# Patient Record
Sex: Female | Born: 1990 | Hispanic: Yes | Marital: Single | Smoking: Never smoker
Health system: Southern US, Community
[De-identification: ages and names within clinical notes are randomized; demographics above are authoritative.]

## PROBLEM LIST (undated history)

## (undated) DIAGNOSIS — R87619 Unspecified abnormal cytological findings in specimens from cervix uteri: Secondary | ICD-10-CM

## (undated) DIAGNOSIS — IMO0002 Reserved for concepts with insufficient information to code with codable children: Secondary | ICD-10-CM

## (undated) HISTORY — PX: NO PAST SURGERIES: SHX2092

---

## 2010-09-22 ENCOUNTER — Other Ambulatory Visit: Payer: Self-pay | Admitting: Family Medicine

## 2010-09-22 DIAGNOSIS — Z3689 Encounter for other specified antenatal screening: Secondary | ICD-10-CM

## 2010-09-24 ENCOUNTER — Ambulatory Visit (HOSPITAL_COMMUNITY)
Admission: RE | Admit: 2010-09-24 | Discharge: 2010-09-24 | Disposition: A | Discharge status: Home / Self Care | Payer: Medicaid Other | Source: Ambulatory Visit | Attending: Family Medicine | Admitting: Family Medicine

## 2010-09-24 ENCOUNTER — Encounter (HOSPITAL_COMMUNITY): Payer: Self-pay

## 2010-09-24 DIAGNOSIS — Z363 Encounter for antenatal screening for malformations: Secondary | ICD-10-CM | POA: Insufficient documentation

## 2010-09-24 DIAGNOSIS — Z1389 Encounter for screening for other disorder: Secondary | ICD-10-CM | POA: Insufficient documentation

## 2010-09-24 DIAGNOSIS — Z3689 Encounter for other specified antenatal screening: Secondary | ICD-10-CM

## 2010-09-24 DIAGNOSIS — O358XX0 Maternal care for other (suspected) fetal abnormality and damage, not applicable or unspecified: Secondary | ICD-10-CM | POA: Insufficient documentation

## 2011-01-08 ENCOUNTER — Other Ambulatory Visit: Payer: Self-pay | Admitting: Family Medicine

## 2011-01-09 ENCOUNTER — Ambulatory Visit (HOSPITAL_COMMUNITY)
Admission: RE | Admit: 2011-01-09 | Discharge: 2011-01-09 | Disposition: A | Discharge status: Home / Self Care | Payer: Medicaid Other | Source: Ambulatory Visit | Attending: Family Medicine | Admitting: Family Medicine

## 2011-01-09 ENCOUNTER — Inpatient Hospital Stay (HOSPITAL_COMMUNITY)
Admission: AD | Admit: 2011-01-09 | Discharge: 2011-01-09 | Disposition: A | Discharge status: Home / Self Care | Payer: Medicaid Other | Source: Ambulatory Visit | Attending: Obstetrics & Gynecology | Admitting: Obstetrics & Gynecology

## 2011-01-09 ENCOUNTER — Other Ambulatory Visit: Payer: Self-pay | Admitting: Family Medicine

## 2011-01-09 DIAGNOSIS — O365990 Maternal care for other known or suspected poor fetal growth, unspecified trimester, not applicable or unspecified: Secondary | ICD-10-CM | POA: Insufficient documentation

## 2011-01-14 ENCOUNTER — Other Ambulatory Visit: Payer: Self-pay | Admitting: Family Medicine

## 2011-01-14 DIAGNOSIS — O4190X0 Disorder of amniotic fluid and membranes, unspecified, unspecified trimester, not applicable or unspecified: Secondary | ICD-10-CM

## 2011-01-16 ENCOUNTER — Ambulatory Visit (HOSPITAL_COMMUNITY)
Admission: RE | Admit: 2011-01-16 | Discharge: 2011-01-16 | Disposition: A | Discharge status: Home / Self Care | Payer: Medicaid Other | Source: Ambulatory Visit | Attending: Family Medicine | Admitting: Family Medicine

## 2011-01-16 ENCOUNTER — Ambulatory Visit (HOSPITAL_COMMUNITY): Payer: Medicaid Other

## 2011-01-16 DIAGNOSIS — O365990 Maternal care for other known or suspected poor fetal growth, unspecified trimester, not applicable or unspecified: Secondary | ICD-10-CM | POA: Insufficient documentation

## 2011-01-16 DIAGNOSIS — O4190X0 Disorder of amniotic fluid and membranes, unspecified, unspecified trimester, not applicable or unspecified: Secondary | ICD-10-CM

## 2011-01-16 DIAGNOSIS — Z3689 Encounter for other specified antenatal screening: Secondary | ICD-10-CM | POA: Insufficient documentation

## 2011-01-21 ENCOUNTER — Inpatient Hospital Stay (HOSPITAL_COMMUNITY)
Admission: AD | Admit: 2011-01-21 | Discharge: 2011-01-23 | DRG: 775 | Disposition: A | Discharge status: Home / Self Care | Payer: Medicaid Other | Source: Ambulatory Visit | Attending: Family Medicine | Admitting: Family Medicine

## 2011-01-21 DIAGNOSIS — O365990 Maternal care for other known or suspected poor fetal growth, unspecified trimester, not applicable or unspecified: Secondary | ICD-10-CM | POA: Diagnosis present

## 2011-01-21 DIAGNOSIS — O4100X0 Oligohydramnios, unspecified trimester, not applicable or unspecified: Principal | ICD-10-CM | POA: Diagnosis present

## 2011-01-21 DIAGNOSIS — Z22330 Carrier of Group B streptococcus: Secondary | ICD-10-CM

## 2011-01-21 DIAGNOSIS — O9989 Other specified diseases and conditions complicating pregnancy, childbirth and the puerperium: Secondary | ICD-10-CM

## 2011-01-21 DIAGNOSIS — O99892 Other specified diseases and conditions complicating childbirth: Secondary | ICD-10-CM

## 2011-01-21 LAB — CBC
HCT: 34 % — ABNORMAL LOW (ref 36.0–46.0)
Hemoglobin: 11.4 g/dL — ABNORMAL LOW (ref 12.0–15.0)
MCH: 29.2 pg (ref 26.0–34.0)
MCV: 87.2 fL (ref 78.0–100.0)
RBC: 3.9 MIL/uL (ref 3.87–5.11)
WBC: 17.7 10*3/uL — ABNORMAL HIGH (ref 4.0–10.5)

## 2012-05-23 ENCOUNTER — Other Ambulatory Visit (HOSPITAL_COMMUNITY): Payer: Self-pay | Admitting: Nurse Practitioner

## 2012-05-23 DIAGNOSIS — Z3689 Encounter for other specified antenatal screening: Secondary | ICD-10-CM

## 2012-05-23 LAB — OB RESULTS CONSOLE HEPATITIS B SURFACE ANTIGEN: Hepatitis B Surface Ag: NEGATIVE

## 2012-05-23 LAB — OB RESULTS CONSOLE RPR: RPR: NONREACTIVE

## 2012-05-23 LAB — OB RESULTS CONSOLE ABO/RH: RH Type: POSITIVE

## 2012-05-24 ENCOUNTER — Other Ambulatory Visit (HOSPITAL_COMMUNITY): Payer: Self-pay

## 2012-05-27 ENCOUNTER — Ambulatory Visit (HOSPITAL_COMMUNITY)
Admission: RE | Admit: 2012-05-27 | Discharge: 2012-05-27 | Disposition: A | Discharge status: Home / Self Care | Payer: Medicaid Other | Source: Ambulatory Visit | Attending: Nurse Practitioner | Admitting: Nurse Practitioner

## 2012-05-27 DIAGNOSIS — O358XX0 Maternal care for other (suspected) fetal abnormality and damage, not applicable or unspecified: Secondary | ICD-10-CM | POA: Insufficient documentation

## 2012-05-27 DIAGNOSIS — Z363 Encounter for antenatal screening for malformations: Secondary | ICD-10-CM | POA: Insufficient documentation

## 2012-05-27 DIAGNOSIS — Z3689 Encounter for other specified antenatal screening: Secondary | ICD-10-CM

## 2012-05-27 DIAGNOSIS — Z1389 Encounter for screening for other disorder: Secondary | ICD-10-CM | POA: Insufficient documentation

## 2012-08-10 NOTE — L&D Delivery Note (Signed)
Delivery Note At 10:40 PM a viable female was delivered via Vaginal, Spontaneous Delivery (Presentation: Left Occiput Anterior).  APGAR: 9, 9; weight .   Placenta status: Intact, Spontaneous.  Cord:  with the following complications: None.   Anesthesia:   Episiotomy: None Lacerations: small peri-urethral  Suture Repair: n/a Est. Blood Loss (mL): 150  Mom to postpartum.  Baby to nursery-stable.  Kelly Monroe, Kelly Monroe 09/25/2012, 11:00 PM

## 2012-09-25 ENCOUNTER — Encounter (HOSPITAL_COMMUNITY): Payer: Self-pay | Admitting: *Deleted

## 2012-09-25 ENCOUNTER — Inpatient Hospital Stay (HOSPITAL_COMMUNITY)
Admission: AD | Admit: 2012-09-25 | Discharge: 2012-09-27 | DRG: 774 | Disposition: A | Discharge status: Home / Self Care | Payer: Medicaid Other | Source: Ambulatory Visit | Attending: Obstetrics & Gynecology | Admitting: Obstetrics & Gynecology

## 2012-09-25 DIAGNOSIS — Z22330 Carrier of Group B streptococcus: Secondary | ICD-10-CM

## 2012-09-25 DIAGNOSIS — O99892 Other specified diseases and conditions complicating childbirth: Secondary | ICD-10-CM | POA: Diagnosis present

## 2012-09-25 HISTORY — DX: Reserved for concepts with insufficient information to code with codable children: IMO0002

## 2012-09-25 HISTORY — DX: Unspecified abnormal cytological findings in specimens from cervix uteri: R87.619

## 2012-09-25 LAB — CBC
HCT: 33.6 % — ABNORMAL LOW (ref 36.0–46.0)
MCV: 87 fL (ref 78.0–100.0)
RBC: 3.86 MIL/uL — ABNORMAL LOW (ref 3.87–5.11)
RDW: 13.9 % (ref 11.5–15.5)
WBC: 12.6 10*3/uL — ABNORMAL HIGH (ref 4.0–10.5)

## 2012-09-25 LAB — TYPE AND SCREEN: Antibody Screen: NEGATIVE

## 2012-09-25 MED ORDER — FLEET ENEMA 7-19 GM/118ML RE ENEM: 1.0000 | ENEMA | RECTAL | Status: DC | PRN

## 2012-09-25 MED ORDER — OXYCODONE-ACETAMINOPHEN 5-325 MG PO TABS: 1.0000 | ORAL_TABLET | ORAL | Status: DC | PRN

## 2012-09-25 MED ORDER — ONDANSETRON HCL 4 MG/2ML IJ SOLN: 4.0000 mg | Freq: Four times a day (QID) | INTRAMUSCULAR | Status: DC | PRN

## 2012-09-25 MED ORDER — LACTATED RINGERS IV SOLN
INTRAVENOUS | Status: DC
  Administered 2012-09-25 (×2): via INTRAVENOUS

## 2012-09-25 MED ORDER — LACTATED RINGERS IV SOLN: 500.0000 mL | INTRAVENOUS | Status: DC | PRN

## 2012-09-25 MED ORDER — LIDOCAINE HCL (PF) 1 % IJ SOLN
30.0000 mL | INTRAMUSCULAR | Status: DC | PRN
  Filled 2012-09-25: qty 30

## 2012-09-25 MED ORDER — CITRIC ACID-SODIUM CITRATE 334-500 MG/5ML PO SOLN: 30.0000 mL | ORAL | Status: DC | PRN

## 2012-09-25 MED ORDER — OXYTOCIN 40 UNITS IN LACTATED RINGERS INFUSION - SIMPLE MED
62.5000 mL/h | INTRAVENOUS | Status: DC
  Administered 2012-09-25: 62.5 mL/h via INTRAVENOUS
  Filled 2012-09-25: qty 1000

## 2012-09-25 MED ORDER — SODIUM CHLORIDE 0.9 % IV SOLN
2.0000 g | Freq: Once | INTRAVENOUS | Status: AC
  Administered 2012-09-25: 2 g via INTRAVENOUS
  Filled 2012-09-25: qty 2000

## 2012-09-25 MED ORDER — ACETAMINOPHEN 325 MG PO TABS: 650.0000 mg | ORAL_TABLET | ORAL | Status: DC | PRN

## 2012-09-25 MED ORDER — IBUPROFEN 600 MG PO TABS
600.0000 mg | ORAL_TABLET | Freq: Four times a day (QID) | ORAL | Status: DC | PRN
  Administered 2012-09-25: 600 mg via ORAL
  Filled 2012-09-25: qty 1

## 2012-09-25 MED ORDER — OXYTOCIN BOLUS FROM INFUSION: 500.0000 mL | INTRAVENOUS | Status: DC

## 2012-09-25 NOTE — MAU Note (Signed)
Contractions every 5 minutes with some vaginal spotting.

## 2012-09-25 NOTE — H&P (Signed)
Kelly Monroe is a 22 y.o. female G3P2002 at 38w 2/7d presenting for onset of labor. She notes that contractions started at 4 PM and have increased in frequency. They are regular and painful. No leakage of fluids.    Patient speaks Spanish was accompanied by interpreter.   Maternal Medical History:  Reason for admission: Contractions.   Contractions: Onset was 3-5 hours ago.   Frequency: regular.    Fetal activity: Perceived fetal activity is normal.   Last perceived fetal movement was within the past hour.    Prenatal complications: no prenatal complications   OB History   Grav Para Term Preterm Abortions TAB SAB Ect Mult Living   1              No past medical history on file. No past surgical history on file. Family History: family history is not on file. Social History:  has no tobacco, alcohol, and drug history on file.   Prenatal Transfer Tool  Maternal Diabetes: No Genetic Screening: Normal Maternal Ultrasounds/Referrals: Normal Fetal Ultrasounds or other Referrals:  None Maternal Substance Abuse:  No Significant Maternal Medications:  None Significant Maternal Lab Results:  Lab values include: Group B Strep positive Other Comments:  None  Review of Systems  All other systems reviewed and are negative.    Dilation: 6 Effacement (%): 70 Station: -2 Exam by:: L. Munford RN There were no vitals taken for this visit. Maternal Exam:  Uterine Assessment: Contraction strength is firm.  Contraction frequency is regular.   Abdomen: Patient reports no abdominal tenderness. Fetal presentation: vertex  Introitus: Normal vulva. Normal vagina.  Ferning test: not done.   Pelvis: adequate for delivery.      Physical Exam  Constitutional: She is oriented to person, place, and time. She appears well-developed and well-nourished. She appears distressed.  HENT:  Head: Normocephalic and atraumatic.  Eyes: Conjunctivae are normal.  Neck: Normal range of  motion.  Cardiovascular: Normal rate and regular rhythm.   Respiratory: Effort normal and breath sounds normal.  GI: Soft. Bowel sounds are normal.  Musculoskeletal: She exhibits no edema.  Neurological: She is alert and oriented to person, place, and time. She has normal reflexes.    Prenatal labs: ABO, Rh:  O pos Antibody:  neg Rubella:  immune RPR:   NR HBsAg:   neg HIV:   neg GBS: Positive (02/04 0000)   Assessment/Plan: 22 year old F G3P2002 @ 38w 2d by LMP presenting in active labor.  - Admit to L&D - Give amp for GBS positive - Does not want epidural or pain med - Toco and FHR per protocol   Clinton Sawyer, EDWARD 09/25/2012, 9:28 PM  I was consulted RE: exam and agree with above.  [place], CNM 09/25/2012 11:41 PM

## 2012-09-26 ENCOUNTER — Encounter (HOSPITAL_COMMUNITY): Payer: Self-pay | Admitting: *Deleted

## 2012-09-26 DIAGNOSIS — Z22330 Carrier of Group B streptococcus: Secondary | ICD-10-CM

## 2012-09-26 DIAGNOSIS — O9989 Other specified diseases and conditions complicating pregnancy, childbirth and the puerperium: Secondary | ICD-10-CM

## 2012-09-26 LAB — CBC
MCH: 28.5 pg (ref 26.0–34.0)
MCHC: 32.6 g/dL (ref 30.0–36.0)
Platelets: 179 10*3/uL (ref 150–400)
RBC: 3.89 MIL/uL (ref 3.87–5.11)

## 2012-09-26 MED ORDER — DIPHENHYDRAMINE HCL 25 MG PO CAPS: 25.0000 mg | ORAL_CAPSULE | Freq: Four times a day (QID) | ORAL | Status: DC | PRN

## 2012-09-26 MED ORDER — TETANUS-DIPHTH-ACELL PERTUSSIS 5-2.5-18.5 LF-MCG/0.5 IM SUSP
0.5000 mL | Freq: Once | INTRAMUSCULAR | Status: AC
  Administered 2012-09-27: 0.5 mL via INTRAMUSCULAR

## 2012-09-26 MED ORDER — PRENATAL MULTIVITAMIN CH
1.0000 | ORAL_TABLET | Freq: Every day | ORAL | Status: DC
  Administered 2012-09-26: 1 via ORAL
  Filled 2012-09-26: qty 1

## 2012-09-26 MED ORDER — WITCH HAZEL-GLYCERIN EX PADS: 1.0000 "application " | MEDICATED_PAD | CUTANEOUS | Status: DC | PRN

## 2012-09-26 MED ORDER — ZOLPIDEM TARTRATE 5 MG PO TABS: 5.0000 mg | ORAL_TABLET | Freq: Every evening | ORAL | Status: DC | PRN

## 2012-09-26 MED ORDER — OXYCODONE-ACETAMINOPHEN 5-325 MG PO TABS: 1.0000 | ORAL_TABLET | ORAL | Status: DC | PRN

## 2012-09-26 MED ORDER — BENZOCAINE-MENTHOL 20-0.5 % EX AERO
1.0000 "application " | INHALATION_SPRAY | CUTANEOUS | Status: DC | PRN
  Administered 2012-09-26: 1 via TOPICAL
  Filled 2012-09-26: qty 56

## 2012-09-26 MED ORDER — INFLUENZA VIRUS VACC SPLIT PF IM SUSP: 0.5000 mL | INTRAMUSCULAR | Status: DC

## 2012-09-26 MED ORDER — LANOLIN HYDROUS EX OINT: TOPICAL_OINTMENT | CUTANEOUS | Status: DC | PRN

## 2012-09-26 MED ORDER — DIBUCAINE 1 % RE OINT: 1.0000 "application " | TOPICAL_OINTMENT | RECTAL | Status: DC | PRN

## 2012-09-26 MED ORDER — SENNOSIDES-DOCUSATE SODIUM 8.6-50 MG PO TABS
2.0000 | ORAL_TABLET | Freq: Every day | ORAL | Status: DC
  Administered 2012-09-26: 2 via ORAL

## 2012-09-26 MED ORDER — ONDANSETRON HCL 4 MG/2ML IJ SOLN: 4.0000 mg | INTRAMUSCULAR | Status: DC | PRN

## 2012-09-26 MED ORDER — ONDANSETRON HCL 4 MG PO TABS: 4.0000 mg | ORAL_TABLET | ORAL | Status: DC | PRN

## 2012-09-26 MED ORDER — IBUPROFEN 600 MG PO TABS
600.0000 mg | ORAL_TABLET | Freq: Four times a day (QID) | ORAL | Status: DC
Start: 2012-09-26 — End: 2012-09-27
  Administered 2012-09-26 – 2012-09-27 (×5): 600 mg via ORAL
  Filled 2012-09-26 (×5): qty 1

## 2012-09-26 MED ORDER — SIMETHICONE 80 MG PO CHEW: 80.0000 mg | CHEWABLE_TABLET | ORAL | Status: DC | PRN

## 2012-09-26 NOTE — Progress Notes (Signed)
UR chart review completed.  

## 2012-09-26 NOTE — Progress Notes (Signed)
Post Partum Day 1 Subjective: no complaints and + flatus Pt doing well. No complaints at this time other than some mild abdominal pain. Nurse claimed pt was bleeding a lot earlier postpartum. Pt states bleeding has improved.  Objective: Blood pressure 100/63, pulse 76, temperature 98.2 F (36.8 C), temperature source Oral, resp. rate 18, height 5\' 4"  (1.626 m), weight 77.111 kg (170 lb), unknown if currently breastfeeding.  Physical Exam:  General: alert, cooperative and no distress Lochia: appropriate Uterine Fundus: soft DVT Evaluation: No evidence of DVT seen on physical exam. Negative Homan's sign.   Recent Labs  09/25/12 2145 09/26/12 0540  HGB 11.4* 11.1*  HCT 33.6* 34.1*    Assessment/Plan: Plan for discharge tomorrow and Contraception Mirena IUD Breastfeeding: Pt latching on well. Feeding well. No complaints. Circumcision: Pt does not want child circumcised.  Pain controlled with Motrin, has not needed percocet.    LOS: 1 day   [place] 09/26/2012, 7:19 AM

## 2012-09-26 NOTE — Progress Notes (Signed)
I was present for the exam and agree with above. Small amount lochia. Hgb stable. Fundus firm.  [place], [place] 09/26/2012 7:44 AM

## 2012-09-27 MED ORDER — DOCUSATE SODIUM 100 MG PO CAPS: 100.0000 mg | ORAL_CAPSULE | Freq: Two times a day (BID) | ORAL | Status: DC | PRN

## 2012-09-27 MED ORDER — IBUPROFEN 600 MG PO TABS: 600.0000 mg | ORAL_TABLET | Freq: Four times a day (QID) | ORAL | Status: DC

## 2012-09-27 NOTE — Discharge Summary (Signed)
PPD#2 Kelly Monroe is a 22 y.o. [id number] presenting at [redacted]w[redacted]d with active labor. Patient progressed normally and had a spontaneous vaginal delivery with no complications. Her postpartum course has been unremarkable. She is breastfeeding and will [facility] for postpartum care and Mirena for contraception.  Patient doing well, no acute complaints today. Pt states very little bleeding and no abdominal pain. Pt ready to go home.  Obstetric Discharge Summary Reason for Admission: onset of labor Prenatal Procedures: none Intrapartum Procedures: spontaneous vaginal delivery Postpartum Procedures: none Complications-Operative and Postpartum: Bleeding immediately postpartum, HgB stable, resolved. Hemoglobin  Date Value Range Status  09/26/2012 11.1* 12.0 - 15.0 g/dL Final     HCT  Date Value Range Status  09/26/2012 34.1* 36.0 - 46.0 % Final    Physical Exam:  General: alert, cooperative and no distress Lochia: appropriate Uterine Fundus: soft DVT Evaluation: No evidence of DVT seen on physical exam. Negative Homan's sign.  Discharge Diagnoses: Term Pregnancy-delivered  Discharge Information: Date: 09/27/2012 Activity: pelvic rest Diet: routine Medications: motrin Condition: stable Instructions: refer to practice specific booklet Discharge to: home  Breastfeeding: baby latching on with no problems, mild areolar soreness. Circumcision: No Contraception: Mirena IUD, information given Patient would like to be discharged today.  Newborn Data: Live born female  Birth Weight: 8 lb 0.4 oz (3640 g) APGAR: 9, 9  Home with father.  [place] 09/27/2012, 7:34 AM  I saw and examined patient, reviewed delivery summary, progress notes, labs and history. I agree with above student note. Discharge orders done. Kelly Form, MD

## 2012-09-27 NOTE — Progress Notes (Signed)
Spoke to mother about Kelly being a Kelly Monroe in the room.  She states she understands.

## 2012-09-29 NOTE — Discharge Summary (Signed)
Attestation of Attending Supervision of Advanced Practitioner (CNM/NP): Evaluation and management procedures were performed by the Advanced Practitioner under my supervision and collaboration.  I have reviewed the Advanced Practitioner's note and chart, and I agree with the management and plan.  Gaelyn Tukes 09/29/2012 11:53 AM   

## 2012-10-06 IMAGING — US US OB FOLLOW-UP
1 series · 12 of 28 positions shown · non-contrast
Comparison: none

[Series 1: us ob follow up · 12 of 49 slices shown]
[im 2/49]
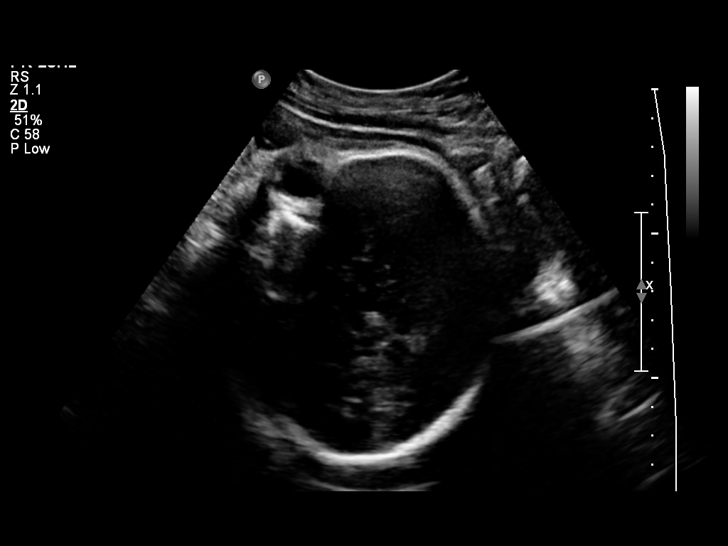
[im 6/49]
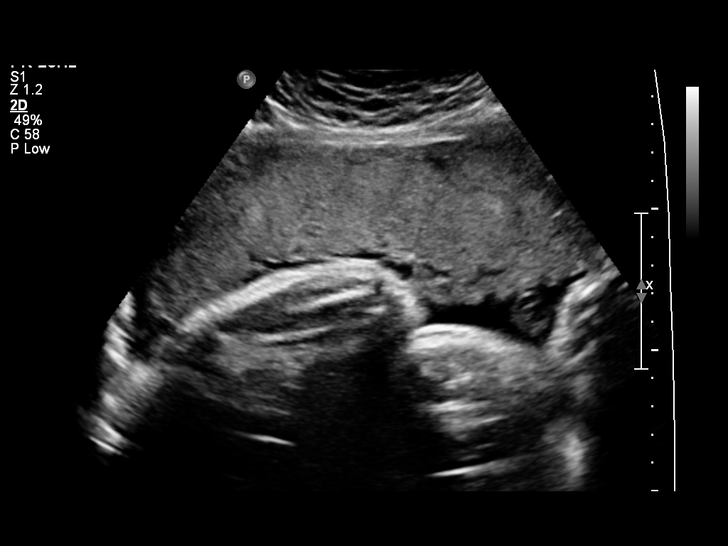
[im 9/49]
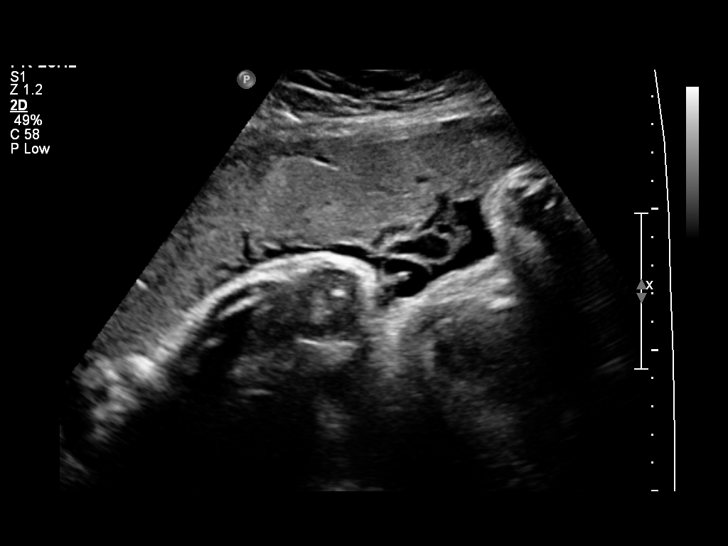
[im 15/49]
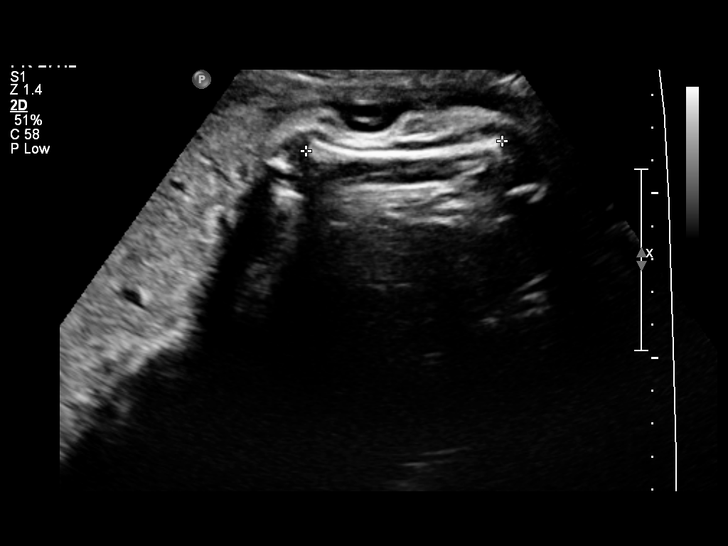
[im 18/49]
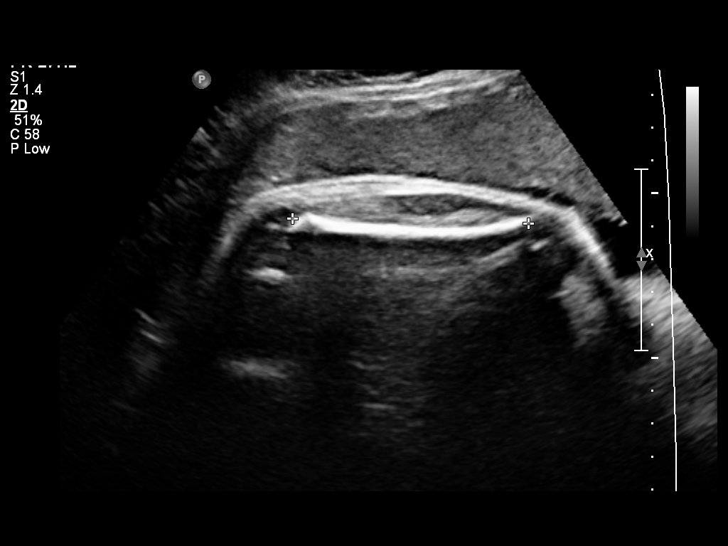
[im 22/49]
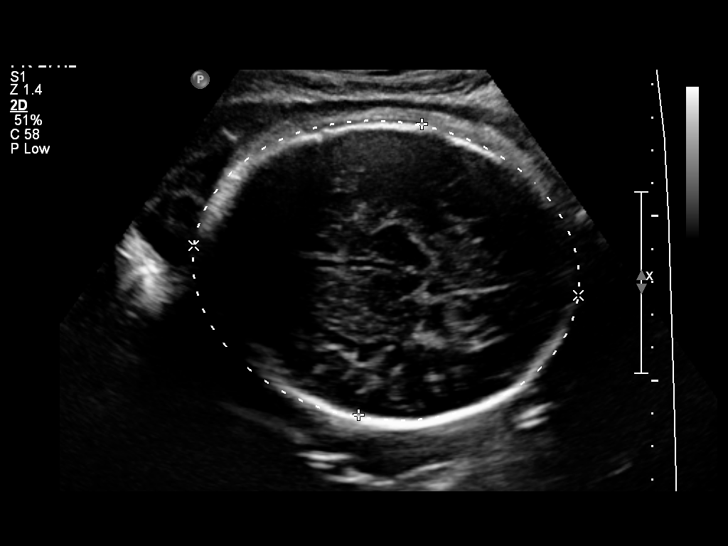
[im 27/49]
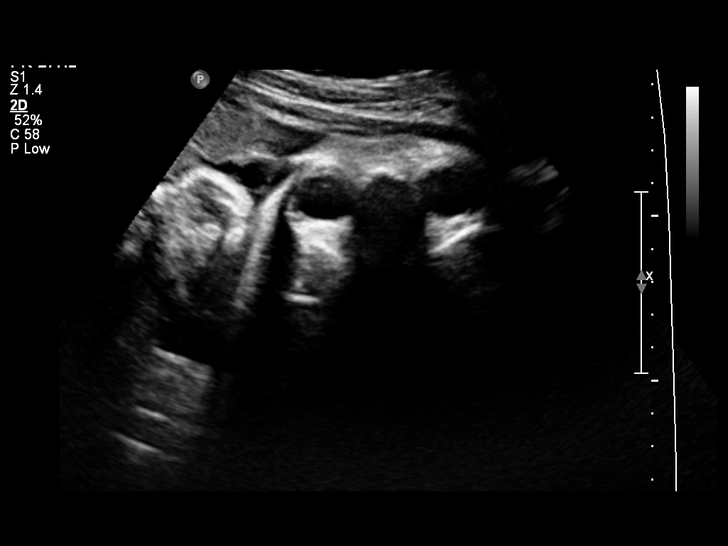
[im 31/49]
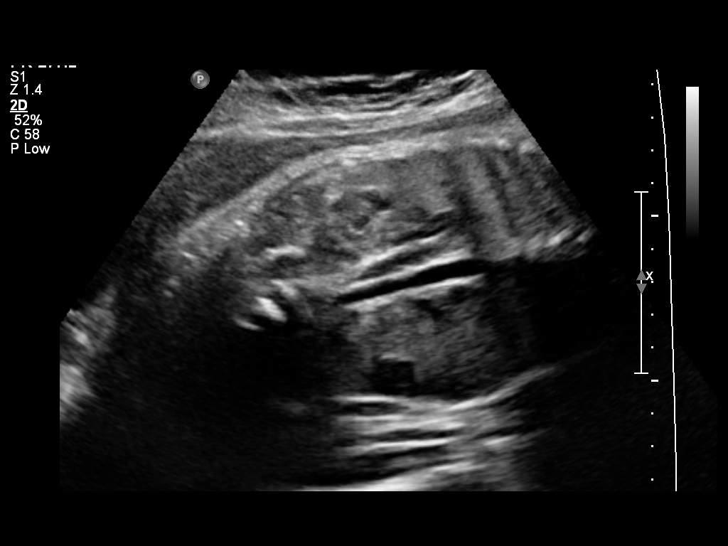
[im 34/49]
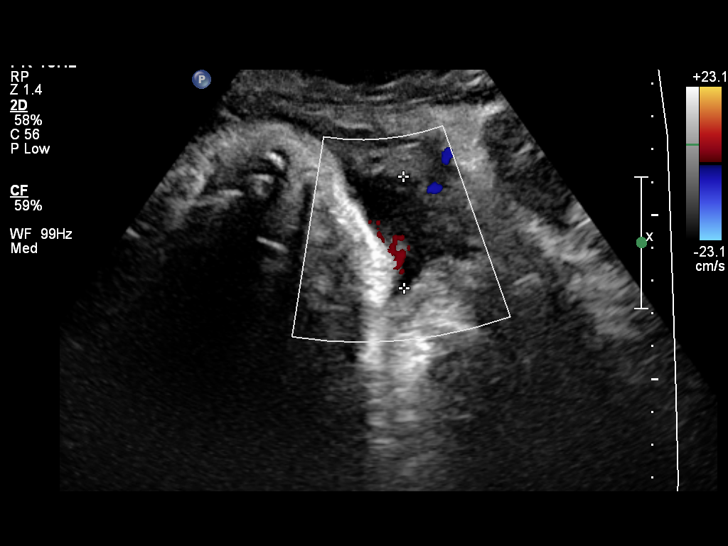
[im 40/49]
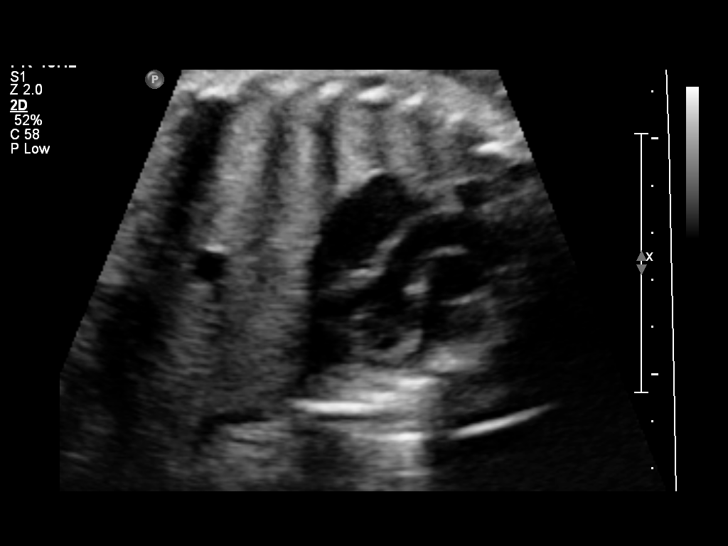
[im 43/49]
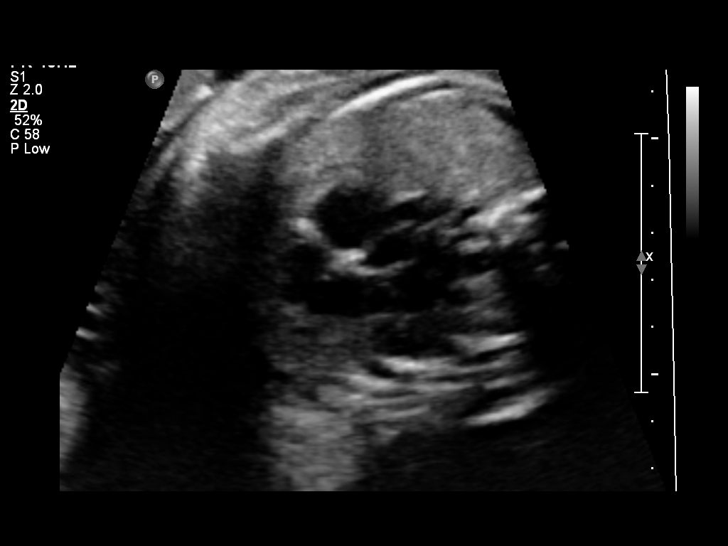
[im 47/49]
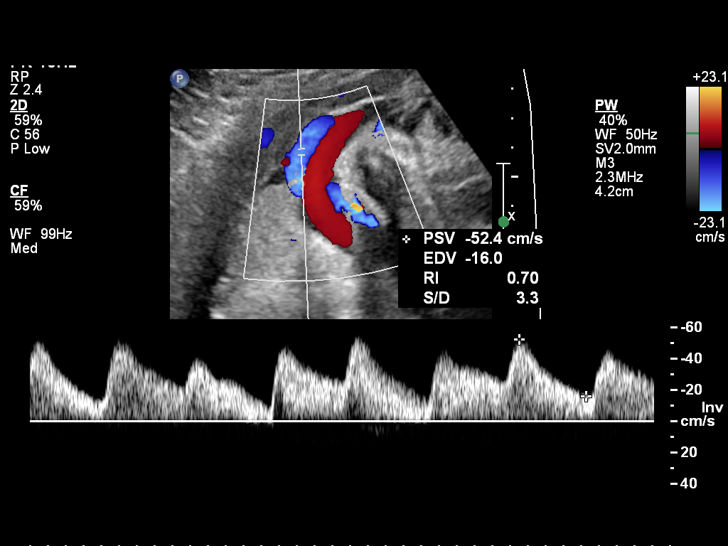

[12 of 28 positions shown; findings below may reference images not displayed]

OBSTETRICS REPORT
                      (Signed Final 01/09/2011 [DATE])

 Order#:         [id number]
                 42_O
Procedures

 US OB FOLLOW UP                                       76816.1
 US UA CORD DOPPLER                                    76827.2
Indications

 Size less than dates (Small for gestational [AGE]
 FGR)
 Assess Fetal Growth / Estimated Fetal Weight
Fetal Evaluation

 Fetal Heart Rate:  133                          bpm
 Cardiac Activity:  Observed
 Presentation:      Cephalic
 Placenta:          Anterior, above cervical os
 P. Cord            Previously Visualized
 Insertion:

 Amniotic Fluid
 AFI FV:      Subjectively low-normal
 AFI Sum:     8.51    cm       13  %Tile     Larg Pckt:    3.39  cm
 RUQ:   2.43    cm   RLQ:    2.69   cm    LUQ:   3.39    cm
Biometry

 BPD:     89.7  mm     G. Age:  36w 2d                CI:        75.89   70 - 86
                                                      FL/HC:      21.9   20.8 -

 HC:     326.4  mm     G. Age:  37w 0d       21  %    HC/AC:      1.07   0.92 -

 AC:     304.5  mm     G. Age:  34w 3d        4  %    FL/BPD:     79.8   71 - 87
 FL:      71.6  mm     G. Age:  36w 5d       35  %    FL/AC:      23.5   20 - 24
 HUM:     59.6  mm     G. Age:  34w 4d       20  %

 Est. FW:    3325  gm    5 lb 15 oz      30  %
Gestational Age

 LMP:           37w 2d        Date:  04/23/10                 EDD:   01/28/11
 U/S Today:     36w 1d                                        EDD:   02/05/11
 Best:          37w 2d     Det. By:  LMP  (04/23/10)          EDD:   01/28/11
Anatomy

 Cranium:           Appears normal      Aortic Arch:       Previously seen
 Fetal Cavum:       Previously seen     Ductal Arch:       Previously seen
 Ventricles:        Appears normal      Diaphragm:         Appears normal
 Choroid Plexus:    Previously seen     Stomach:           Appears
                                                           normal, left
                                                           sided
 Cerebellum:        Previously seen     Abdomen:           Appears normal
 Posterior Fossa:   Previously seen     Abdominal Wall:    Previously seen
 Nuchal Fold:       Not applicable      Cord Vessels:      Appears normal
                    (>20 wks GA)                           (3 vessel cord)
 Face:              Appears normal      Kidneys:           Appear normal
                    (lips/profile/orbit
                    s)
 Heart:             Appears normal      Bladder:           Appears normal
                    (4 chamber &
                    axis)
 RVOT:              Appears normal      Spine:             Previously seen
 LVOT:              Appears normal      Limbs:             Previously seen

 Other:     Heels and 5th digit previously seen. Nasal bone
            visualized. Fetus appears to be a female.
Doppler - Fetal Vessels

 Umbilical Artery
 S/D:   2.8            74  %tile
 Umbilical Artery
 Absent DFV:    No     Reverse DFV:    No

Cervix Uterus Adnexa

 Cervix:       Not visualized (advanced GA >34 wks)
 Left Ovary:    Previously seen.
 Right Ovary:   Previously seen
Impression

 Assigned GA is currently 37w 2d.   Downward trending of
 fetal growth curve, with EFW currently at 30 %ile and AC at 4
 %ile.
 Amniotic fluid in low-normal range, with AFI of 8.51 cm.
 UA Doppler within normal limits.
 No late developing fetal abnormalities seen involving
 visualized anatomy.

 questions or concerns.

## 2012-10-07 ENCOUNTER — Telehealth (HOSPITAL_COMMUNITY): Payer: Self-pay | Admitting: *Deleted

## 2012-10-07 NOTE — Telephone Encounter (Signed)
Resolve episode 

## 2014-02-22 IMAGING — US US OB DETAIL+14 WK
1 of 2 series · 12 of 28 positions shown · non-contrast
Comparison: none

[Series 1: us ob detail +14 wk · 12 of 80 slices shown]
[im 1/80]
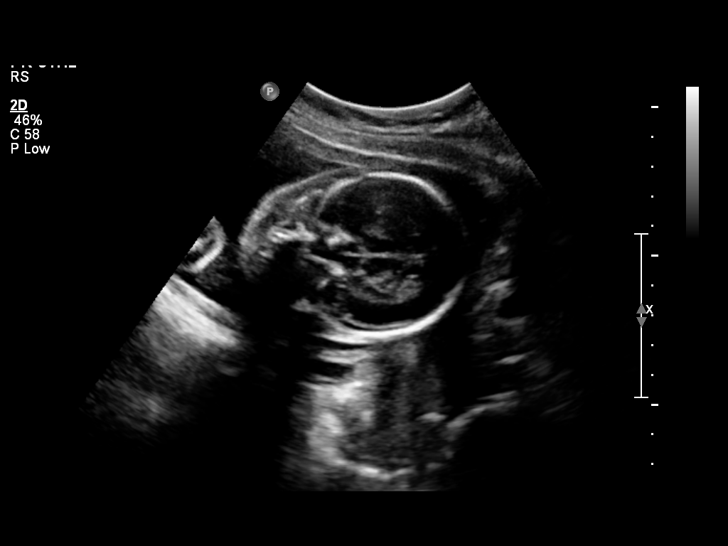
[im 7/80]
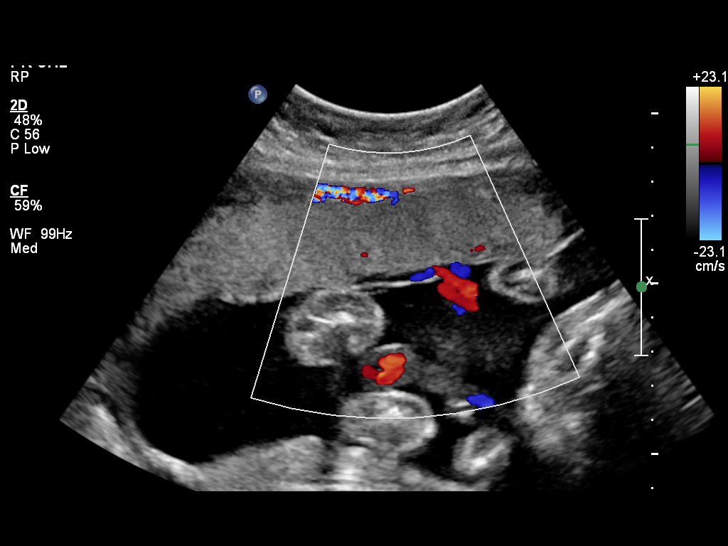
[im 13/80]
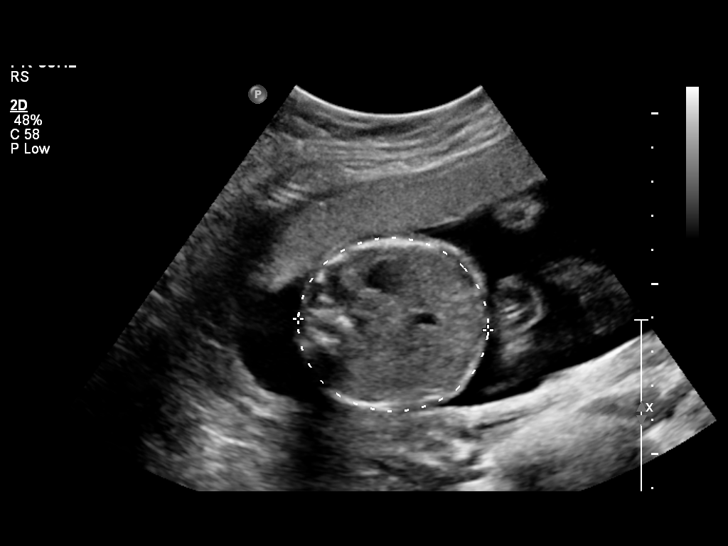
[im 22/80]
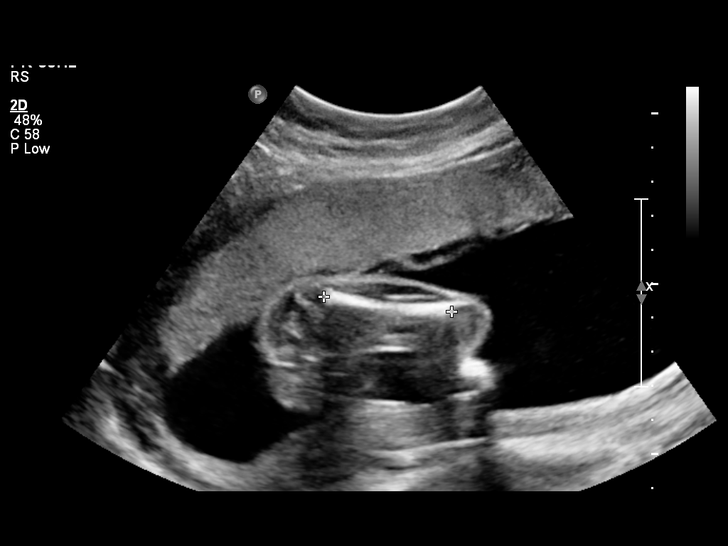
[im 28/80]
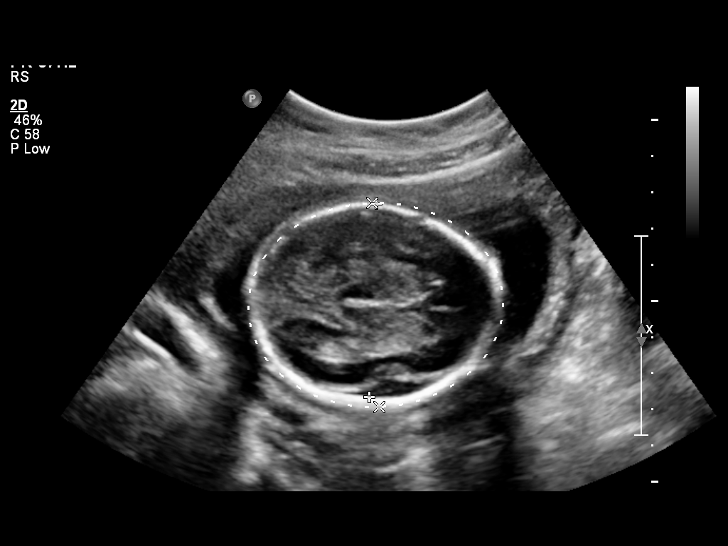
[im 34/80]
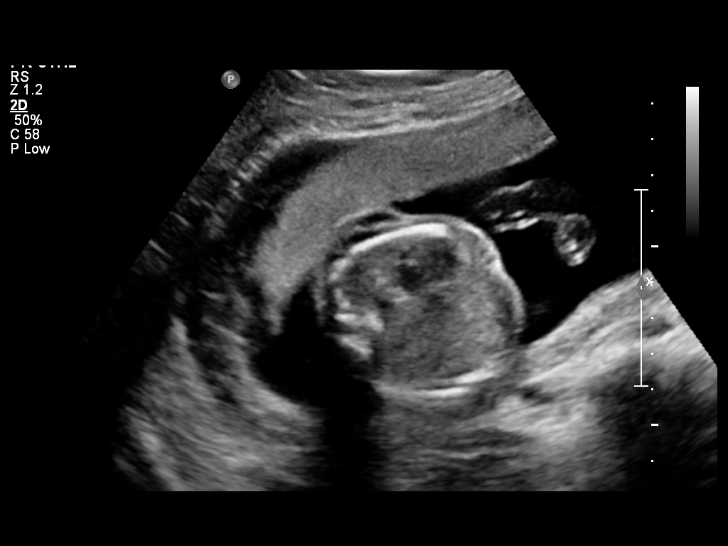
[im 43/80]
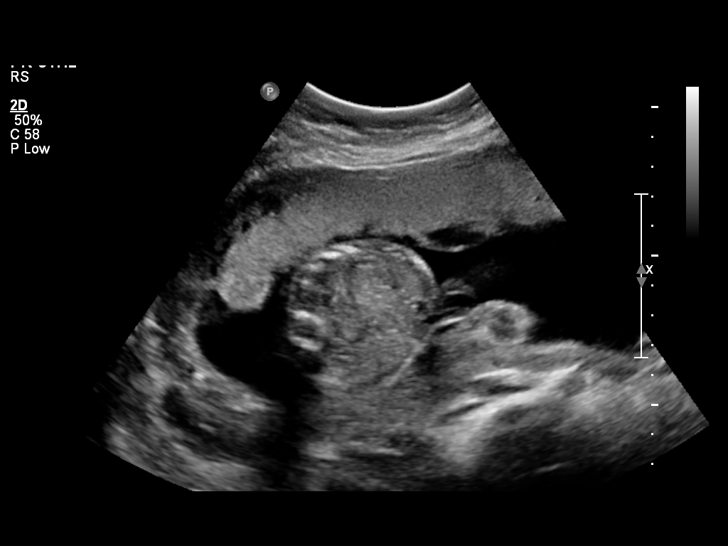
[im 49/80]
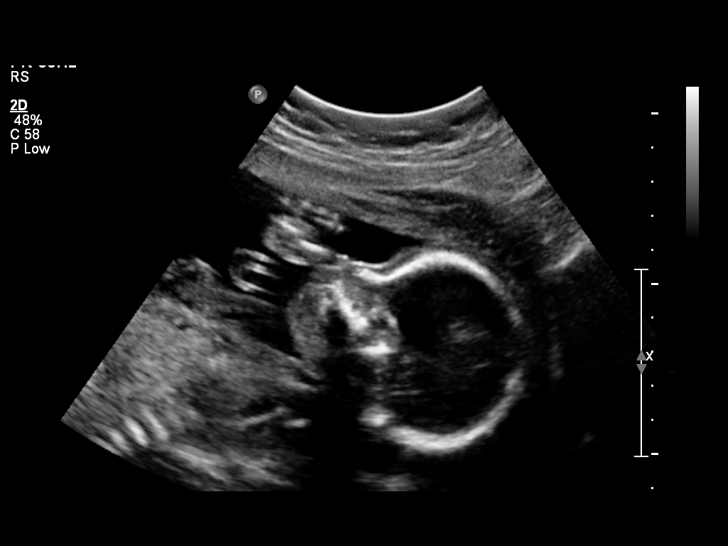
[im 55/80]
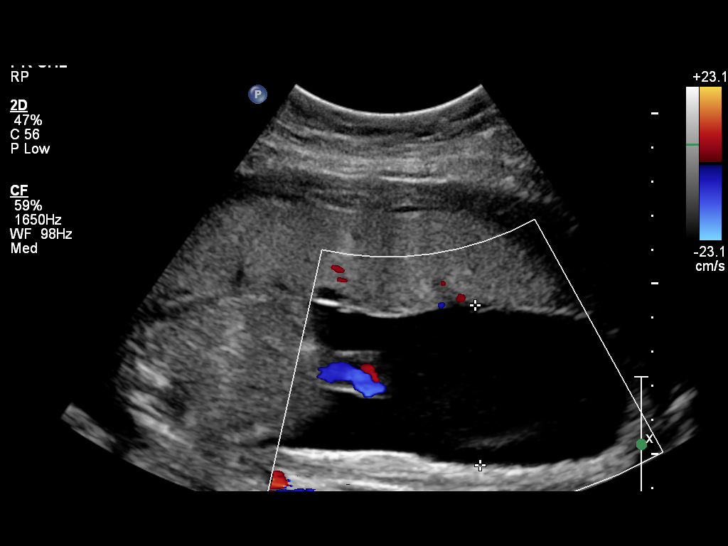
[im 64/80]
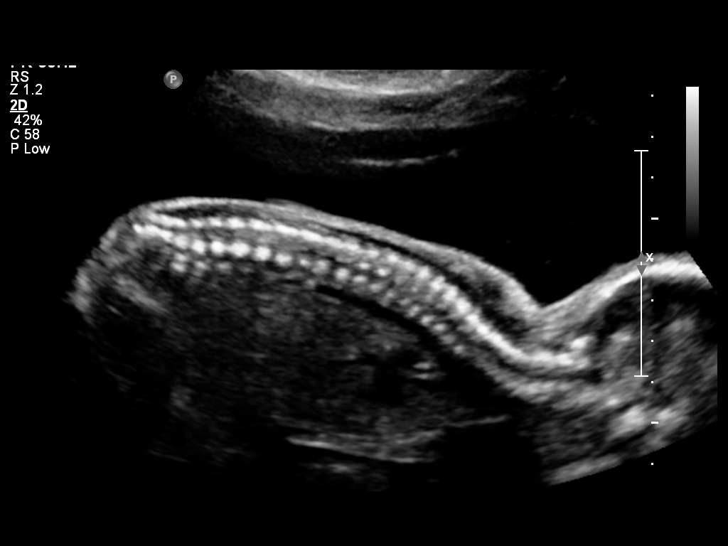
[im 70/80]
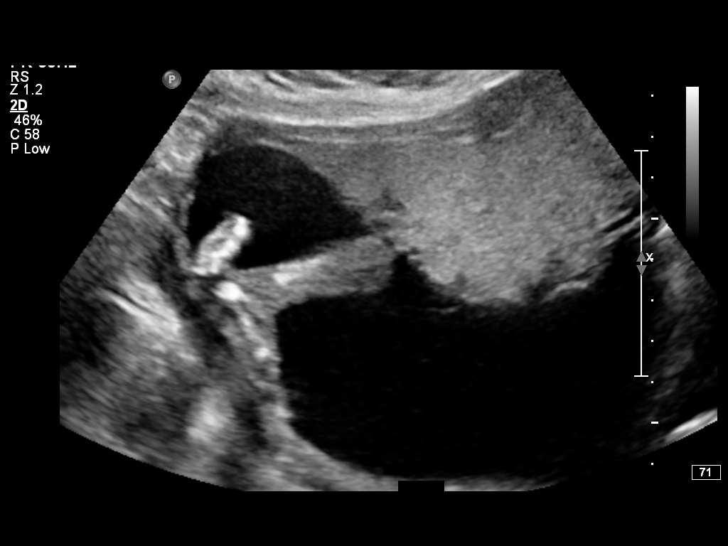
[im 76/80]
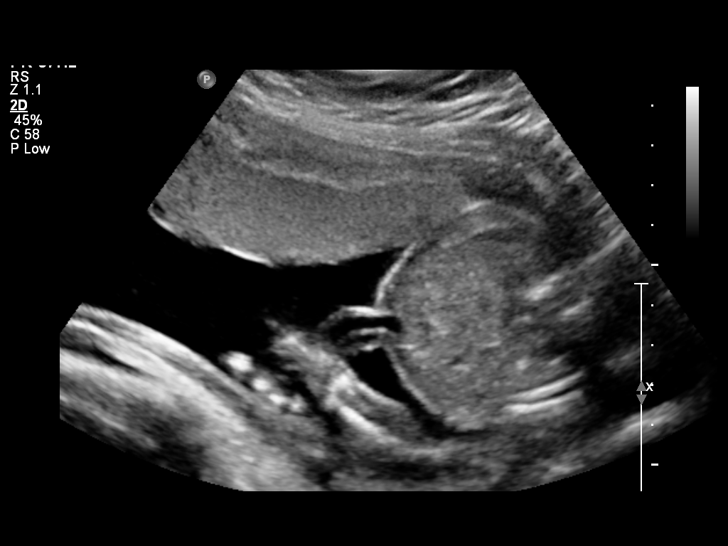

[12 of 28 positions shown; findings below may reference images not displayed]

OBSTETRICS REPORT
                      (Signed Final 05/27/2012 [DATE])

             KENNEDI

Service(s) Provided

 US OB DETAIL + 14 WK                                  76811.0
Indications

 Detailed fetal anatomic survey
Fetal Evaluation

 Num Of Fetuses:    1
 Preg. Location:    Intrauterine
 Fetal Heart Rate:  132                         bpm
 Cardiac Activity:  Observed
 Presentation:      Cephalic
 Placenta:          Anterior, above cervical os
 P. Cord            Visualized, central
 Insertion:

 Amniotic Fluid
 AFI FV:      Subjectively within normal limits
                                             Larg Pckt:     5.2  cm
Biometry

 BPD:     53.6  mm    G. Age:   22w 2d                CI:        72.58   70 - 86
                                                      FL/HC:      19.1   15.9 -

 HC:     200.1  mm    G. Age:   22w 1d       85  %    HC/AC:      1.20   1.06 -

 AC:     166.7  mm    G. Age:   21w 5d       66  %    FL/BPD:
 FL:      38.2  mm    G. Age:   22w 2d       80  %    FL/AC:      22.9   20 - 24
 HUM:     35.6  mm    G. Age:   22w 3d       79  %
 Est. FW:     465  gm          1 lb      59  %
Gestational Age

 LMP:           21w 0d       Date:   01/01/12                 EDD:   10/07/12
 U/S Today:     22w 1d                                        EDD:   09/29/12
 Best:          21w 0d    Det. By:   LMP  (01/01/12)          EDD:   10/07/12
2nd Trimester Genetic Sonogram - Trisomy 21 Screening
 Age:                                             21          Risk=1:   885

 Structural anomalies (inc. cardiac):             No
 Echogenic bowel:                                 No
 Hypoplastic / absent midphalanx 5th Digit:       No
 Pyelectasis:                                     No
 2-vessel umbilical cord:                         No
 Echogenic cardiac foci:                          No
Anatomy

 Cranium:          Appears normal         Aortic Arch:      Appears normal
 Fetal Cavum:      Appears normal         Ductal Arch:      Appears normal
 Ventricles:       Appears normal         Diaphragm:        Appears normal
 Choroid Plexus:   Appears normal         Stomach:          Appears normal, left
                                                            sided
 Cerebellum:       Appears normal         Abdomen:          Appears normal
 Posterior Fossa:  Appears normal         Abdominal Wall:   Appears nml (cord
                                                            insert, abd wall)
 Nuchal Fold:      Not applicable (>20    Cord Vessels:     Appears normal (3
                   wks GA)                                  vessel cord)
 Face:             Appears normal         Kidneys:          Appear normal
                   (orbits and profile)
 Lips:             Appears normal         Bladder:          Appears normal
 Heart:            Appears normal         Spine:            Appears normal
                   (4CH, axis, and
                   situs)
 RVOT:             Appears normal         Lower             Appears normal
                                          Extremities:
 LVOT:             Appears normal         Upper             Appears normal
                                          Extremities:

 Other:  Male gender. Heels and 5th digit visualized.
Cervix Uterus Adnexa

 Cervical Length:   3.3       cm

 Cervix:       Normal appearance by transabdominal scan.
 Uterus:       No abnormality visualized.
 Left Ovary:   No adnexal mass visualized.
 Right Ovary:  No adnexal mass visualized.

 Adnexa:     No abnormality visualized.
Impression

 Siup demonstrating an EGA by ultrasound of 22w 1d. This
 corresponds well with expected EGA by LMP of 21w 0d.

 No focal fetal or placental abnormalities are noted with a
 good anatomic evaluation possible. No soft markers for Down
 Syndrome are seen. Correlation with other aneuploidy
 screening results, if available, would be recommended for a
 more complete risk assessment.

 Subjectively and quantitatively normal amniotic fluid volume.
 Normal cervical length.

## 2014-06-11 ENCOUNTER — Encounter (HOSPITAL_COMMUNITY): Payer: Self-pay | Admitting: *Deleted

## 2016-08-10 NOTE — L&D Delivery Note (Signed)
26 y.o. [id number] at 976w1d admitted for PDIOL delivered a viable female infant at 0530 in cephalic, ROA position. Loose nuchal cord, easily reduced at the perineum. Left anterior shoulder delivered with ease. 60 sec delayed cord clamping. Cord clamped x2 and cut. Placenta delivered spontaneously intact, with 3VC. Fundus firm on exam with massage and pitocin. Good hemostasis noted.  Anesthesia: None Laceration: None Suture: N/A Good hemostasis noted. EBL: 50 cc  Mom and baby recovering in LDR.    Apgars: APGAR (1 MIN):  9 APGAR (5 MINS): 9  Weight: Pending skin to skin  Sponge and instrument count were correct x2. Placenta sent to L&D. Breast and bottle feeding, BTL for contraception.  [place], DO FM Resident PGY-1 03/11/2017 5:40 AM  Patient is a [id number] at 6576w1d who was admitted for postdates IOL, essentially uncomplicated prenatal course.  She progressed with augmentation via Pitocin.  I was gloved and present for delivery in its entirety.  Second stage of labor progressed, baby delivered after pushing with a few contractions.  No decels during second stage noted.  Complications: none  Lacerations: none  EBL: 50cc  Will keep NPO for ppBTL later today.  Cam HaiSHAW, KIMBERLY, CNM 5:46 AM 03/11/2017

## 2016-08-20 ENCOUNTER — Other Ambulatory Visit (HOSPITAL_COMMUNITY): Payer: Self-pay | Admitting: Nurse Practitioner

## 2016-08-20 DIAGNOSIS — Z3682 Encounter for antenatal screening for nuchal translucency: Secondary | ICD-10-CM

## 2016-08-20 DIAGNOSIS — Z3A13 13 weeks gestation of pregnancy: Secondary | ICD-10-CM

## 2016-08-20 LAB — OB RESULTS CONSOLE GC/CHLAMYDIA
Chlamydia: POSITIVE
GC PROBE AMP, GENITAL: NEGATIVE

## 2016-08-20 LAB — OB RESULTS CONSOLE HIV ANTIBODY (ROUTINE TESTING): HIV: NONREACTIVE

## 2016-08-20 LAB — OB RESULTS CONSOLE ABO/RH: RH Type: POSITIVE

## 2016-08-20 LAB — OB RESULTS CONSOLE HEPATITIS B SURFACE ANTIGEN: Hepatitis B Surface Ag: NEGATIVE

## 2016-08-20 LAB — OB RESULTS CONSOLE RUBELLA ANTIBODY, IGM: Rubella: IMMUNE

## 2016-08-20 LAB — OB RESULTS CONSOLE ANTIBODY SCREEN: ANTIBODY SCREEN: NEGATIVE

## 2016-08-20 LAB — OB RESULTS CONSOLE RPR: RPR: NONREACTIVE

## 2016-08-31 ENCOUNTER — Encounter (HOSPITAL_COMMUNITY): Payer: Self-pay | Admitting: Nurse Practitioner

## 2016-09-09 ENCOUNTER — Encounter (HOSPITAL_COMMUNITY): Payer: Self-pay | Admitting: *Deleted

## 2016-09-10 ENCOUNTER — Ambulatory Visit (HOSPITAL_COMMUNITY)
Admission: RE | Admit: 2016-09-10 | Discharge: 2016-09-10 | Disposition: A | Discharge status: Home / Self Care | Payer: Medicaid Other | Source: Ambulatory Visit | Attending: Nurse Practitioner | Admitting: Nurse Practitioner

## 2016-09-10 ENCOUNTER — Encounter (HOSPITAL_COMMUNITY): Payer: Self-pay

## 2016-09-10 ENCOUNTER — Other Ambulatory Visit (HOSPITAL_COMMUNITY): Payer: Self-pay | Admitting: Nurse Practitioner

## 2016-09-10 DIAGNOSIS — Z3492 Encounter for supervision of normal pregnancy, unspecified, second trimester: Secondary | ICD-10-CM

## 2016-09-10 DIAGNOSIS — Z363 Encounter for antenatal screening for malformations: Secondary | ICD-10-CM | POA: Insufficient documentation

## 2016-09-10 DIAGNOSIS — Z3687 Encounter for antenatal screening for uncertain dates: Secondary | ICD-10-CM | POA: Insufficient documentation

## 2016-09-10 DIAGNOSIS — Z3A15 15 weeks gestation of pregnancy: Secondary | ICD-10-CM | POA: Insufficient documentation

## 2016-09-10 DIAGNOSIS — Z3A13 13 weeks gestation of pregnancy: Secondary | ICD-10-CM

## 2016-09-10 DIAGNOSIS — Z3682 Encounter for antenatal screening for nuchal translucency: Secondary | ICD-10-CM

## 2016-09-11 ENCOUNTER — Other Ambulatory Visit (HOSPITAL_COMMUNITY): Payer: Self-pay | Admitting: Nurse Practitioner

## 2016-09-11 DIAGNOSIS — Z3689 Encounter for other specified antenatal screening: Secondary | ICD-10-CM

## 2016-09-11 DIAGNOSIS — Z3A20 20 weeks gestation of pregnancy: Secondary | ICD-10-CM

## 2016-10-08 LAB — OB RESULTS CONSOLE GC/CHLAMYDIA: CHLAMYDIA, DNA PROBE: NEGATIVE

## 2016-10-12 ENCOUNTER — Ambulatory Visit (HOSPITAL_COMMUNITY)
Admission: RE | Admit: 2016-10-12 | Discharge: 2016-10-12 | Disposition: A | Discharge status: Home / Self Care | Payer: Medicaid Other | Source: Ambulatory Visit | Attending: Nurse Practitioner | Admitting: Nurse Practitioner

## 2016-10-12 ENCOUNTER — Ambulatory Visit (HOSPITAL_COMMUNITY): Payer: Medicaid Other

## 2016-10-12 DIAGNOSIS — Z3689 Encounter for other specified antenatal screening: Secondary | ICD-10-CM

## 2016-10-12 DIAGNOSIS — Z362 Encounter for other antenatal screening follow-up: Secondary | ICD-10-CM | POA: Diagnosis not present

## 2016-10-12 DIAGNOSIS — Z3A19 19 weeks gestation of pregnancy: Secondary | ICD-10-CM | POA: Diagnosis not present

## 2016-10-12 DIAGNOSIS — Z3A20 20 weeks gestation of pregnancy: Secondary | ICD-10-CM

## 2016-10-13 ENCOUNTER — Other Ambulatory Visit (HOSPITAL_COMMUNITY): Payer: Self-pay | Admitting: Nurse Practitioner

## 2016-10-13 DIAGNOSIS — Z3A20 20 weeks gestation of pregnancy: Secondary | ICD-10-CM

## 2016-10-13 DIAGNOSIS — Z3689 Encounter for other specified antenatal screening: Secondary | ICD-10-CM

## 2016-12-30 ENCOUNTER — Encounter (HOSPITAL_COMMUNITY): Payer: Self-pay

## 2017-01-28 LAB — OB RESULTS CONSOLE GBS: STREP GROUP B AG: NEGATIVE

## 2017-01-28 LAB — OB RESULTS CONSOLE GC/CHLAMYDIA
Chlamydia: NEGATIVE
GC PROBE AMP, GENITAL: NEGATIVE

## 2017-03-04 ENCOUNTER — Telehealth (HOSPITAL_COMMUNITY): Payer: Self-pay | Admitting: *Deleted

## 2017-03-04 NOTE — Telephone Encounter (Signed)
[phone] interpreter number Preadmission screen

## 2017-03-04 NOTE — Telephone Encounter (Signed)
Preadmission screen  

## 2017-03-11 ENCOUNTER — Encounter (HOSPITAL_COMMUNITY): Payer: Self-pay

## 2017-03-11 ENCOUNTER — Inpatient Hospital Stay (HOSPITAL_COMMUNITY)
Admission: RE | Admit: 2017-03-11 | Discharge: 2017-03-12 | DRG: 767 | Disposition: A | Discharge status: Home / Self Care | Payer: Medicaid Other | Source: Ambulatory Visit | Attending: Obstetrics & Gynecology | Admitting: Obstetrics & Gynecology

## 2017-03-11 ENCOUNTER — Inpatient Hospital Stay (HOSPITAL_COMMUNITY): Payer: Medicaid Other | Admitting: Anesthesiology

## 2017-03-11 ENCOUNTER — Encounter (HOSPITAL_COMMUNITY)
Admission: RE | Disposition: A | Discharge status: Home / Self Care | Payer: Self-pay | Source: Ambulatory Visit | Attending: Obstetrics & Gynecology

## 2017-03-11 DIAGNOSIS — Z302 Encounter for sterilization: Secondary | ICD-10-CM | POA: Diagnosis not present

## 2017-03-11 DIAGNOSIS — Z3A41 41 weeks gestation of pregnancy: Secondary | ICD-10-CM

## 2017-03-11 DIAGNOSIS — O480 Post-term pregnancy: Secondary | ICD-10-CM | POA: Diagnosis present

## 2017-03-11 HISTORY — PX: TUBAL LIGATION: SHX77

## 2017-03-11 LAB — TYPE AND SCREEN
ABO/RH(D): O POS
ANTIBODY SCREEN: NEGATIVE

## 2017-03-11 LAB — CBC
HEMATOCRIT: 31.8 % — AB (ref 36.0–46.0)
HEMOGLOBIN: 10.3 g/dL — AB (ref 12.0–15.0)
MCH: 28.7 pg (ref 26.0–34.0)
MCHC: 32.4 g/dL (ref 30.0–36.0)
MCV: 88.6 fL (ref 78.0–100.0)
Platelets: 207 10*3/uL (ref 150–400)
RBC: 3.59 MIL/uL — AB (ref 3.87–5.11)
RDW: 13 % (ref 11.5–15.5)
WBC: 13.6 10*3/uL — AB (ref 4.0–10.5)

## 2017-03-11 LAB — RPR: RPR Ser Ql: NONREACTIVE

## 2017-03-11 SURGERY — LIGATION, FALLOPIAN TUBE, POSTPARTUM
Anesthesia: General | Site: Abdomen | Laterality: Bilateral

## 2017-03-11 MED ORDER — IBUPROFEN 600 MG PO TABS
600.0000 mg | ORAL_TABLET | Freq: Four times a day (QID) | ORAL | Status: DC
  Administered 2017-03-11 – 2017-03-12 (×3): 600 mg via ORAL
  Filled 2017-03-11 (×4): qty 1

## 2017-03-11 MED ORDER — TETANUS-DIPHTH-ACELL PERTUSSIS 5-2.5-18.5 LF-MCG/0.5 IM SUSP: 0.5000 mL | Freq: Once | INTRAMUSCULAR | Status: DC

## 2017-03-11 MED ORDER — DIBUCAINE 1 % RE OINT: 1.0000 "application " | TOPICAL_OINTMENT | RECTAL | Status: DC | PRN

## 2017-03-11 MED ORDER — BUPIVACAINE HCL (PF) 0.5 % IJ SOLN
INTRAMUSCULAR | Status: AC
  Filled 2017-03-11: qty 30

## 2017-03-11 MED ORDER — PROPOFOL 10 MG/ML IV BOLUS
INTRAVENOUS | Status: DC | PRN
  Administered 2017-03-11: 150 mg via INTRAVENOUS

## 2017-03-11 MED ORDER — ONDANSETRON HCL 4 MG/2ML IJ SOLN: 4.0000 mg | Freq: Four times a day (QID) | INTRAMUSCULAR | Status: DC | PRN

## 2017-03-11 MED ORDER — ACETAMINOPHEN 325 MG PO TABS: 650.0000 mg | ORAL_TABLET | ORAL | Status: DC | PRN

## 2017-03-11 MED ORDER — OXYCODONE HCL 5 MG PO TABS
5.0000 mg | ORAL_TABLET | Freq: Four times a day (QID) | ORAL | Status: DC | PRN
  Administered 2017-03-11: 5 mg via ORAL
  Filled 2017-03-11: qty 1

## 2017-03-11 MED ORDER — PRENATAL MULTIVITAMIN CH
1.0000 | ORAL_TABLET | Freq: Every day | ORAL | Status: DC
  Filled 2017-03-11: qty 1

## 2017-03-11 MED ORDER — WITCH HAZEL-GLYCERIN EX PADS: 1.0000 "application " | MEDICATED_PAD | CUTANEOUS | Status: DC | PRN

## 2017-03-11 MED ORDER — FENTANYL CITRATE (PF) 250 MCG/5ML IJ SOLN
INTRAMUSCULAR | Status: AC
  Filled 2017-03-11: qty 5

## 2017-03-11 MED ORDER — LACTATED RINGERS IV SOLN: 500.0000 mL | INTRAVENOUS | Status: DC | PRN

## 2017-03-11 MED ORDER — COCONUT OIL OIL: 1.0000 "application " | TOPICAL_OIL | Status: DC | PRN

## 2017-03-11 MED ORDER — FENTANYL CITRATE (PF) 100 MCG/2ML IJ SOLN
100.0000 ug | INTRAMUSCULAR | Status: DC | PRN
  Administered 2017-03-11: 100 ug via INTRAVENOUS
  Filled 2017-03-11: qty 2

## 2017-03-11 MED ORDER — DEXAMETHASONE SODIUM PHOSPHATE 10 MG/ML IJ SOLN
INTRAMUSCULAR | Status: AC
  Filled 2017-03-11: qty 1

## 2017-03-11 MED ORDER — SOD CITRATE-CITRIC ACID 500-334 MG/5ML PO SOLN: 30.0000 mL | ORAL | Status: DC | PRN

## 2017-03-11 MED ORDER — LIDOCAINE HCL (CARDIAC) 20 MG/ML IV SOLN
INTRAVENOUS | Status: AC
  Filled 2017-03-11: qty 5

## 2017-03-11 MED ORDER — ONDANSETRON HCL 4 MG/2ML IJ SOLN
INTRAMUSCULAR | Status: DC | PRN
  Administered 2017-03-11: 4 mg via INTRAVENOUS

## 2017-03-11 MED ORDER — MIDAZOLAM HCL 2 MG/2ML IJ SOLN
INTRAMUSCULAR | Status: AC
  Filled 2017-03-11: qty 2

## 2017-03-11 MED ORDER — ROCURONIUM BROMIDE 100 MG/10ML IV SOLN
INTRAVENOUS | Status: DC | PRN
  Administered 2017-03-11: 20 mg via INTRAVENOUS

## 2017-03-11 MED ORDER — GLYCOPYRROLATE 0.2 MG/ML IJ SOLN
INTRAMUSCULAR | Status: DC | PRN
  Administered 2017-03-11: .5 mg via INTRAVENOUS

## 2017-03-11 MED ORDER — DIPHENHYDRAMINE HCL 25 MG PO CAPS: 25.0000 mg | ORAL_CAPSULE | Freq: Four times a day (QID) | ORAL | Status: DC | PRN

## 2017-03-11 MED ORDER — OXYTOCIN 40 UNITS IN LACTATED RINGERS INFUSION - SIMPLE MED
1.0000 m[IU]/min | INTRAVENOUS | Status: DC
  Administered 2017-03-11: 2 m[IU]/min via INTRAVENOUS

## 2017-03-11 MED ORDER — SUCCINYLCHOLINE CHLORIDE 20 MG/ML IJ SOLN
INTRAMUSCULAR | Status: DC | PRN
  Administered 2017-03-11: 100 mg via INTRAVENOUS

## 2017-03-11 MED ORDER — LACTATED RINGERS IV SOLN
INTRAVENOUS | Status: DC
  Administered 2017-03-11: 11:00:00 via INTRAVENOUS

## 2017-03-11 MED ORDER — GLYCOPYRROLATE 0.2 MG/ML IJ SOLN
INTRAMUSCULAR | Status: AC
  Filled 2017-03-11: qty 1

## 2017-03-11 MED ORDER — METOCLOPRAMIDE HCL 10 MG PO TABS
10.0000 mg | ORAL_TABLET | Freq: Once | ORAL | Status: AC
  Administered 2017-03-11: 10 mg via ORAL
  Filled 2017-03-11: qty 1

## 2017-03-11 MED ORDER — FLEET ENEMA 7-19 GM/118ML RE ENEM: 1.0000 | ENEMA | RECTAL | Status: DC | PRN

## 2017-03-11 MED ORDER — OXYCODONE-ACETAMINOPHEN 5-325 MG PO TABS: 2.0000 | ORAL_TABLET | ORAL | Status: DC | PRN

## 2017-03-11 MED ORDER — METOCLOPRAMIDE HCL 5 MG/ML IJ SOLN: 10.0000 mg | Freq: Once | INTRAMUSCULAR | Status: DC | PRN

## 2017-03-11 MED ORDER — NEOSTIGMINE METHYLSULFATE 10 MG/10ML IV SOLN
INTRAVENOUS | Status: DC | PRN
  Administered 2017-03-11: 2.5 mg via INTRAVENOUS

## 2017-03-11 MED ORDER — LACTATED RINGERS IV SOLN
INTRAVENOUS | Status: DC
Start: 2017-03-11 — End: 2017-03-12
  Administered 2017-03-11: 20 mL/h via INTRAVENOUS

## 2017-03-11 MED ORDER — OXYTOCIN BOLUS FROM INFUSION
500.0000 mL | Freq: Once | INTRAVENOUS | Status: AC
  Administered 2017-03-11: 500 mL via INTRAVENOUS

## 2017-03-11 MED ORDER — BENZOCAINE-MENTHOL 20-0.5 % EX AERO: 1.0000 "application " | INHALATION_SPRAY | CUTANEOUS | Status: DC | PRN

## 2017-03-11 MED ORDER — LIDOCAINE 2% (20 MG/ML) 5 ML SYRINGE
INTRAMUSCULAR | Status: DC | PRN
  Administered 2017-03-11: 60 mg via INTRAVENOUS

## 2017-03-11 MED ORDER — OXYTOCIN 40 UNITS IN LACTATED RINGERS INFUSION - SIMPLE MED
2.5000 [IU]/h | INTRAVENOUS | Status: DC
  Filled 2017-03-11: qty 1000

## 2017-03-11 MED ORDER — FENTANYL CITRATE (PF) 100 MCG/2ML IJ SOLN: 25.0000 ug | INTRAMUSCULAR | Status: DC | PRN

## 2017-03-11 MED ORDER — FAMOTIDINE 20 MG PO TABS
40.0000 mg | ORAL_TABLET | Freq: Once | ORAL | Status: AC
  Administered 2017-03-11: 40 mg via ORAL
  Filled 2017-03-11: qty 2

## 2017-03-11 MED ORDER — ONDANSETRON HCL 4 MG/2ML IJ SOLN
INTRAMUSCULAR | Status: AC
  Filled 2017-03-11: qty 2

## 2017-03-11 MED ORDER — FENTANYL CITRATE (PF) 100 MCG/2ML IJ SOLN
INTRAMUSCULAR | Status: DC | PRN
  Administered 2017-03-11 (×2): 50 ug via INTRAVENOUS
  Administered 2017-03-11: 100 ug via INTRAVENOUS
  Administered 2017-03-11: 50 ug via INTRAVENOUS

## 2017-03-11 MED ORDER — SIMETHICONE 80 MG PO CHEW: 80.0000 mg | CHEWABLE_TABLET | ORAL | Status: DC | PRN

## 2017-03-11 MED ORDER — TERBUTALINE SULFATE 1 MG/ML IJ SOLN
0.2500 mg | Freq: Once | INTRAMUSCULAR | Status: DC | PRN
  Filled 2017-03-11: qty 1

## 2017-03-11 MED ORDER — PROPOFOL 10 MG/ML IV BOLUS
INTRAVENOUS | Status: AC
  Filled 2017-03-11: qty 20

## 2017-03-11 MED ORDER — ONDANSETRON HCL 4 MG PO TABS: 4.0000 mg | ORAL_TABLET | ORAL | Status: DC | PRN

## 2017-03-11 MED ORDER — MEPERIDINE HCL 25 MG/ML IJ SOLN: 6.2500 mg | INTRAMUSCULAR | Status: DC | PRN

## 2017-03-11 MED ORDER — SENNOSIDES-DOCUSATE SODIUM 8.6-50 MG PO TABS
2.0000 | ORAL_TABLET | ORAL | Status: DC
  Administered 2017-03-12: 2 via ORAL
  Filled 2017-03-11: qty 2

## 2017-03-11 MED ORDER — ONDANSETRON HCL 4 MG/2ML IJ SOLN: 4.0000 mg | INTRAMUSCULAR | Status: DC | PRN

## 2017-03-11 MED ORDER — DEXAMETHASONE SODIUM PHOSPHATE 4 MG/ML IJ SOLN
INTRAMUSCULAR | Status: DC | PRN
  Administered 2017-03-11: 10 mg via INTRAVENOUS

## 2017-03-11 MED ORDER — ACETAMINOPHEN 325 MG PO TABS
650.0000 mg | ORAL_TABLET | ORAL | Status: DC | PRN
  Administered 2017-03-11: 650 mg via ORAL
  Filled 2017-03-11: qty 2

## 2017-03-11 MED ORDER — ZOLPIDEM TARTRATE 5 MG PO TABS: 5.0000 mg | ORAL_TABLET | Freq: Every evening | ORAL | Status: DC | PRN

## 2017-03-11 MED ORDER — MISOPROSTOL 25 MCG QUARTER TABLET
25.0000 ug | ORAL_TABLET | ORAL | Status: DC | PRN
  Filled 2017-03-11: qty 1

## 2017-03-11 MED ORDER — LIDOCAINE HCL (PF) 1 % IJ SOLN
30.0000 mL | INTRAMUSCULAR | Status: DC | PRN
  Filled 2017-03-11: qty 30

## 2017-03-11 MED ORDER — BUPIVACAINE HCL 0.5 % IJ SOLN
INTRAMUSCULAR | Status: DC | PRN
  Administered 2017-03-11: 10 mL
  Administered 2017-03-11: 3 mL

## 2017-03-11 MED ORDER — KETOROLAC TROMETHAMINE 30 MG/ML IJ SOLN
INTRAMUSCULAR | Status: DC | PRN
  Administered 2017-03-11: 30 mg via INTRAVENOUS

## 2017-03-11 MED ORDER — OXYCODONE-ACETAMINOPHEN 5-325 MG PO TABS: 1.0000 | ORAL_TABLET | ORAL | Status: DC | PRN

## 2017-03-11 MED ORDER — LACTATED RINGERS IV SOLN
INTRAVENOUS | Status: DC
  Administered 2017-03-11: 02:00:00 via INTRAVENOUS

## 2017-03-11 SURGICAL SUPPLY — 28 items
CLOTH BEACON ORANGE TIMEOUT ST (SAFETY) ×3 IMPLANT
CONTAINER PREFILL 10% NBF 15ML (MISCELLANEOUS) IMPLANT
DERMABOND ADVANCED (GAUZE/BANDAGES/DRESSINGS) ×2
DERMABOND ADVANCED .7 DNX12 (GAUZE/BANDAGES/DRESSINGS) ×1 IMPLANT
DRSG OPSITE POSTOP 3X4 (GAUZE/BANDAGES/DRESSINGS) ×3 IMPLANT
DURAPREP 26ML APPLICATOR (WOUND CARE) ×3 IMPLANT
ELECT REM PT RETURN 9FT ADLT (ELECTROSURGICAL) ×3
ELECTRODE REM PT RTRN 9FT ADLT (ELECTROSURGICAL) ×1 IMPLANT
GLOVE BIOGEL PI IND STRL 7.0 (GLOVE) ×1 IMPLANT
GLOVE BIOGEL PI IND STRL 7.5 (GLOVE) ×1 IMPLANT
GLOVE BIOGEL PI INDICATOR 7.0 (GLOVE) ×2
GLOVE BIOGEL PI INDICATOR 7.5 (GLOVE) ×2
GLOVE SURG SS PI 7.0 STRL IVOR (GLOVE) ×3 IMPLANT
GOWN STRL REUS W/TWL LRG LVL3 (GOWN DISPOSABLE) ×6 IMPLANT
NEEDLE HYPO 22GX1.5 SAFETY (NEEDLE) ×3 IMPLANT
NS IRRIG 1000ML POUR BTL (IV SOLUTION) ×3 IMPLANT
PACK ABDOMINAL MINOR (CUSTOM PROCEDURE TRAY) ×3 IMPLANT
PENCIL BUTTON HOLSTER BLD 10FT (ELECTRODE) IMPLANT
PROTECTOR NERVE ULNAR (MISCELLANEOUS) ×3 IMPLANT
SPONGE LAP 4X18 X RAY DECT (DISPOSABLE) ×3 IMPLANT
SUT MNCRL AB 4-0 PS2 18 (SUTURE) ×3 IMPLANT
SUT PLAIN 2 0 (SUTURE)
SUT PLAIN ABS 2-0 CT1 27XMFL (SUTURE) IMPLANT
SUT VICRYL 0 TIES 12 18 (SUTURE) IMPLANT
SUT VICRYL 0 UR6 27IN ABS (SUTURE) ×3 IMPLANT
SYR CONTROL 10ML LL (SYRINGE) ×3 IMPLANT
TOWEL OR 17X24 6PK STRL BLUE (TOWEL DISPOSABLE) ×6 IMPLANT
TRAY FOLEY CATH SILVER 14FR (SET/KITS/TRAYS/PACK) ×3 IMPLANT

## 2017-03-11 NOTE — Anesthesia Postprocedure Evaluation (Signed)
Anesthesia Post Note  Patient: Glena NorfolkRuby D Sosa Avelar  Procedure(s) Performed: Procedure(s) (LRB): POST PARTUM TUBAL LIGATION (Bilateral)     Patient location during evaluation: PACU Anesthesia Type: General Level of consciousness: awake and alert Pain management: pain level controlled Vital Signs Assessment: post-procedure vital signs reviewed and stable Respiratory status: spontaneous breathing, nonlabored ventilation, respiratory function stable and patient connected to nasal cannula oxygen Cardiovascular status: blood pressure returned to baseline and stable Postop Assessment: no signs of nausea or vomiting Anesthetic complications: no    Last Vitals:  Vitals:   03/11/17 1205 03/11/17 1542  BP: 119/66 111/62  Pulse: 71 63  Resp: 18 18  Temp: 36.8 C 36.7 C    Last Pain:  Vitals:   03/11/17 1544  TempSrc:   PainSc: 5    Pain Goal: Patients Stated Pain Goal: 3 (03/11/17 0445)               Phillips Groutarignan, Homer Miller

## 2017-03-11 NOTE — Op Note (Signed)
Operative Note   03/11/2017  PRE-OP DIAGNOSIS: Desire for permanent sterilization.  Postpartum Day #0   POST-OP DIAGNOSIS: Same  SURGEON: Surgeon(s) and Role:    * Bennett Bingharlie Drevion Offord, MD - Primary  ASSISTANT: None  ANESTHESIA: General and local  PROCEDURE: mini-laparotomy, bilateral tubal ligation via Filshie Clips method  ESTIMATED BLOOD LOSS: 10mL  DRAINS: 375mL UOP via indwelling  TOTAL IV FLUIDS: per anesthesia note mL   SPECIMENS:  none  VTE PROPHYLAXIS: SCDs to the bilateral lower extremities  ANTIBIOTICS: not indicated  COMPLICATIONS: none  DISPOSITION: PACU - hemodynamically stable.  CONDITION: stable  FINDINGS: No intra-abdominal adhesions noted. Smooth, normally contoured uterine fundus and bilateral tubes. Normal appearing tubes and normal ovaries on palpation.   PROCEDURE IN DETAIL: The patient was taken to the OR where anesthesia was administed. The patient was positioned in dorsal supine. The patient was prepped and draped in the normal sterile fashion a 4cm horizontal incision was made approximately 2 finger breadths below the umbilicus, after injection with local anesthesia. The skin was then incised with the scalpel and the underlying tissue dissected with blunt dissection and the fascia nicked in the midline with the scalpel, while lifting up on the fascia, and then extended laterally.  The abdomen was then entered bluntly and a moist lap sponge used to displace the bowel.   The left Fallopian tube was identified by tracing out to the fimbraie, grasped with the Babcock clamps. An avascular midsection of the tube approximately 3-4cm from the cornua was grasped with the babcock clamps and the filshie clip was applied, taking care to incorporate the entire tube.  Attention was then turned to the right fallopian tube after confirmation by tracing the tube out to the fimbriae. The same procedure was then performed on the right Fallopian tube, with excellent hemostasis  was noted from both BTL sites.   The lap sponge was then removed from the abdomen and the fascia closed in running fashion with 0 vicryl and the subcutaneous tissue irrigated and closed with interrupted sutures of 3-0 plain gut. The skin was then closed with 4-0 monocryl and dermabond.   The patient tolerated the procedure well. All counts were correct x 2. The patient was transferred to the recovery room awake, alert and breathing independently.   Kelly Monroe, Jr MD Attending Center for [facility] Mid[facility](Faculty Practice)

## 2017-03-11 NOTE — Anesthesia Preprocedure Evaluation (Signed)
Anesthesia Evaluation  Patient identified by MRN, date of birth, ID band Patient awake    Reviewed: Allergy & Precautions, NPO status , Patient's Chart, lab work & pertinent test results  Airway Mallampati: II  TM Distance: >3 FB Neck ROM: Full    Dental no notable dental hx.    Pulmonary neg pulmonary ROS,    Pulmonary exam normal breath sounds clear to auscultation       Cardiovascular negative cardio ROS Normal cardiovascular exam Rhythm:Regular Rate:Normal     Neuro/Psych negative neurological ROS  negative psych ROS   GI/Hepatic negative GI ROS, Neg liver ROS,   Endo/Other  negative endocrine ROS  Renal/GU negative Renal ROS  negative genitourinary   Musculoskeletal negative musculoskeletal ROS (+)   Abdominal   Peds negative pediatric ROS (+)  Hematology negative hematology ROS (+)   Anesthesia Other Findings   Reproductive/Obstetrics negative OB ROS                             Anesthesia Physical Anesthesia Plan  ASA: II  Anesthesia Plan: General   Post-op Pain Management:    Induction: Intravenous, Rapid sequence and Cricoid pressure planned  PONV Risk Score and Plan: 3 and Ondansetron, Dexamethasone and Midazolam  Airway Management Planned: Oral ETT  Additional Equipment:   Intra-op Plan:   Post-operative Plan: Extubation in OR  Informed Consent: I have reviewed the patients History and Physical, chart, labs and discussed the procedure including the risks, benefits and alternatives for the proposed anesthesia with the patient or authorized representative who has indicated his/her understanding and acceptance.   Dental advisory given  Plan Discussed with: CRNA  Anesthesia Plan Comments: (Pt refuses SAB or epidural. Wants GA despite warnings of increased risk)        Anesthesia Quick Evaluation

## 2017-03-11 NOTE — Progress Notes (Signed)
OB Note D/w pt re: permanency of procedure and if she'd still like it done, in addition to standard surgical risks and she is fine with proceeding. Her only concern is re: the type of analgesia and I told her that anesthesia will d/w that her re: the various options. Can proceed with BTL when OR is ready.   Papers UTD   Cornelia Copaharlie Sallee Hogrefe, Jr MD Attending Center for [facility] (Faculty Practice) 03/11/2017 Time: [phone]

## 2017-03-11 NOTE — Anesthesia Procedure Notes (Signed)
Procedure Name: Intubation Date/Time: 03/11/2017 10:11 AM Performed by: Vernice Jefferson Pre-anesthesia Checklist: Patient identified, Emergency Drugs available, Suction available, Patient being monitored and Timeout performed Patient Re-evaluated:Patient Re-evaluated prior to induction Oxygen Delivery Method: Circle system utilized Preoxygenation: Pre-oxygenation with 100% oxygen Induction Type: Cricoid Pressure applied, IV induction and Rapid sequence Laryngoscope Size: Mac and 3 Grade View: Grade II Tube type: Oral Tube size: 7.0 mm Number of attempts: 1 Airway Equipment and Method: Stylet Placement Confirmation: ETT inserted through vocal cords under direct vision,  breath sounds checked- equal and bilateral and positive ETCO2 Secured at: 21 cm Tube secured with: Tape Dental Injury: Teeth and Oropharynx as per pre-operative assessment

## 2017-03-11 NOTE — H&P (Signed)
LABOR ADMISSION HISTORY AND PHYSICAL  Kelly Monroe is a 26 y.o. female [id number] with IUP at 2253w5d by US presenting for PDIOL. She reports +FMs, No LOF, no VB, no blurry vision, no headaches, no peripheral edema, and no RUQ pain.  She plans on breast and bottle feeding. She requests BTL for birth control.  Dating: By [place] 3632w5d --->  Estimated Date of Delivery: 03/03/17   Prenatal History/Complications:  Past Medical History: Past Medical History:  Diagnosis Date  . Abnormal Pap smear     Past Surgical History: Past Surgical History:  Procedure Laterality Date  . NO PAST SURGERIES      Obstetrical History: OB History    Gravida Para Term Preterm AB Living   4 3 3     3    SAB TAB Ectopic Multiple Live Births           3      Social History: Social History   Social History  . Marital status: Single    Spouse name: N/A  . Number of children: N/A  . Years of education: N/A   Social History Main Topics  . Smoking status: Never Smoker  . Smokeless tobacco: Never Used  . Alcohol use No  . Drug use: No  . Sexual activity: Not Currently   Other Topics Concern  . None   Social History Narrative  . None    Family History: Family History  Problem Relation Age of Onset  . Heart disease Paternal Grandmother   . Hypertension Paternal Grandmother     Allergies: No Known Allergies  Prescriptions Prior to Admission  Medication Sig Dispense Refill Last Dose  . ibuprofen (ADVIL,MOTRIN) 600 MG tablet Take 1 tablet (600 mg total) by mouth every 6 (six) hours. 30 tablet 1 03/10/2017 at 1000  . Prenatal Vit-Fe Fumarate-FA (PRENATAL VITAMIN PO) Take by mouth.   03/10/2017 at 1200  . docusate sodium (COLACE) 100 MG capsule Take 1 capsule (100 mg total) by mouth 2 (two) times daily as needed for constipation. (Patient not taking: Reported on 09/10/2016) 30 capsule 2 Unknown at Unknown time     Review of Systems   All systems reviewed and negative except as stated in  HPI  Blood pressure 115/73, pulse 75, temperature 98.1 F (36.7 C), temperature source Oral, height 5\' 4"  (1.626 m), weight 77.1 kg (170 lb), last menstrual period 06/06/2016, unknown if currently breastfeeding. General appearance: alert, cooperative and no distress Lungs: normal work of breathing Extremities: Homans sign is negative, no sign of DVT Presentation: cephalic Fetal monitoringBaseline: 130 bpm, Variability: Good {> 6 bpm), Accelerations: Reactive and Decelerations: Absent Uterine activity: irregular Dilation: 3.5 Effacement (%): 50 Station: -2 Exam by:: [place] Shirley, DO   Prenatal labs: ABO, Rh: O/Positive/-- (01/11 0000) Antibody: Negative (01/11 0000) Rubella: Immune RPR: Nonreactive (01/11 0000)  HBsAg: Negative (01/11 0000)  HIV: Non-reactive (01/11 0000)  GBS: Negative (06/21 0000)  Glucola1 hr- 76  Prenatal Transfer Tool  Maternal Diabetes: No Genetic Screening: Normal Maternal Ultrasounds/Referrals: Normal Fetal Ultrasounds or other Referrals:  None Maternal Substance Abuse:  No Significant Maternal Medications:  None Significant Maternal Lab Results: Lab values include: Group B Strep negative  Results for orders placed or performed during the hospital encounter of 03/11/17 (from the past 24 hour(s))  CBC   Collection Time: 03/11/17  1:30 AM  Result Value Ref Range   WBC 13.6 (H) 4.0 - 10.5 K/uL   RBC 3.59 (L) 3.87 - 5.11 MIL/uL  Hemoglobin 10.3 (L) 12.0 - 15.0 g/dL   HCT 16.131.8 (L) 09.636.0 - 04.546.0 %   MCV 88.6 78.0 - 100.0 fL   MCH 28.7 26.0 - 34.0 pg   MCHC 32.4 30.0 - 36.0 g/dL   RDW 40.913.0 81.111.5 - 91.415.5 %   Platelets 207 150 - 400 K/uL    Patient Active Problem List   Diagnosis Date Noted  . Normal labor 03/11/2017  . Spontaneous vaginal delivery 09/27/2012    Assessment: Kelly Monroe is a 26 y.o. G4P3003 at 6351w5d here for PDIOL  #Labor: Induction protocol- Start patient on pitocin for adequate contractions #Pain: Does not wish to have  an epidural #FWB: Cat 1 #ID: GBS negative #MOF: breast and bottle #MOC: BTL (papers signed- 01/25/17) #Circ: not desired  [place] Shirley, DO Family Medicine Resident PGY-1  03/11/2017, 3:22 AM  CNM attestation:  I have seen and examined this patient; I agree with above documentation in the resident's note.   Kelly Monroe is a 26 y.o. [phone] here for postdates IOL  PE: BP (!) 107/59   Pulse 65   Temp 98.1 F (36.7 C) (Oral)   Ht 5\' 4"  (1.626 m)   Wt 77.1 kg (170 lb)   LMP 06/06/2016   BMI 29.18 kg/m  Gen: calm comfortable, NAD Resp: normal effort, no distress Abd: gravid  ROS, labs, PMH reviewed  [facility] Anticipate SVD Plan for pp Shari HeritageBTL  Danitra Payano CNM 03/11/2017, 5:44 AM

## 2017-03-11 NOTE — Lactation Note (Signed)
This note was copied from a baby's chart. Lactation Consultation Note  Patient Name: Boy Eartha InchRuby Sosa Avelar NWGNF'AToday's Date: 03/11/2017 Reason for consult: Initial assessment Breastfeeding consultation services and support information given to patient.  Baby is 6311 hours old.  Mom states baby is latching well.  Observed mom latch baby using cradle hold.  Baby latched easily and fed actively.  Instructed to feed with cues and to call for assist prn.  Maternal Data    Feeding Feeding Type: Breast Fed Length of feed: 12 min  LATCH Score Latch: Grasps breast easily, tongue down, lips flanged, rhythmical sucking.  Audible Swallowing: A few with stimulation  Type of Nipple: Everted at rest and after stimulation  Comfort (Breast/Nipple): Soft / non-tender  Hold (Positioning): No assistance needed to correctly position infant at breast.  LATCH Score: 9  Interventions    Lactation Tools Discussed/Used     Consult Status Consult Status: Follow-up Date: 03/12/17 Follow-up type: In-patient    Huston FoleyMOULDEN, Dontavius Keim S 03/11/2017, 5:12 PM

## 2017-03-11 NOTE — Transfer of Care (Signed)
Immediate Anesthesia Transfer of Care Note  Patient: Kelly Monroe  Procedure(s) Performed: Procedure(s): POST PARTUM TUBAL LIGATION (Bilateral)  Patient Location: PACU  Anesthesia Type:General  Level of Consciousness: awake, alert  and oriented  Airway & Oxygen Therapy: Patient Spontanous Breathing and Patient connected to nasal cannula oxygen  Post-op Assessment: Report given to RN and Post -op Vital signs reviewed and stable  Post vital signs: Reviewed and stable  Last Vitals:  Vitals:   03/11/17 0800 03/11/17 1100  BP: (!) 107/58 (!) 111/56  Pulse: 64 86  Resp: 18 14  Temp: 36.9 C (P) 36.8 C    Last Pain:  Vitals:   03/11/17 0800  TempSrc: Oral  PainSc: 0-No pain      Patients Stated Pain Goal: 3 (03/11/17 0445)  Complications: No apparent anesthesia complications

## 2017-03-12 ENCOUNTER — Encounter (HOSPITAL_COMMUNITY): Payer: Self-pay | Admitting: Obstetrics and Gynecology

## 2017-03-12 MED ORDER — OXYCODONE HCL 5 MG PO TABS: 5.0000 mg | ORAL_TABLET | Freq: Four times a day (QID) | ORAL | 0 refills | Status: AC | PRN

## 2017-03-12 MED ORDER — IBUPROFEN 600 MG PO TABS: 600.0000 mg | ORAL_TABLET | Freq: Four times a day (QID) | ORAL | 0 refills | Status: AC | PRN

## 2017-03-12 NOTE — Lactation Note (Signed)
This note was copied from a baby's chart. Lactation Consultation Note  Patient Name: Kelly Monroe Avelar Kelly Monroe: 03/12/2017  Mom and baby to be discharged today.  Mom states baby continues to breastfeed well.  No questions or concerns. Lactation outpatient services and support reviewed and encouraged prn.   Maternal Data    Feeding Feeding Type: Breast Fed Length of feed: 15 min  LATCH Score Latch: Grasps breast easily, tongue down, lips flanged, rhythmical sucking.  Audible Swallowing: A few with stimulation  Type of Nipple: Everted at rest and after stimulation  Comfort (Breast/Nipple): Soft / non-tender  Hold (Positioning): No assistance needed to correctly position infant at breast.  LATCH Score: 9  Interventions    Lactation Tools Discussed/Used     Consult Status      Huston FoleyMOULDEN, Jerauld Bostwick S 03/12/2017, 10:22 AM

## 2017-03-12 NOTE — Discharge Summary (Signed)
OB Discharge Summary     Patient Name: Kelly Monroe DOB: Feb 01, 1991 MRN: [id number]  Date of admission: 03/11/2017 Delivering MD: Cam HaiSHAW, Harsha Yusko D   Date of discharge: 03/12/2017  Admitting diagnosis: INDUCTION desires sterilization, no longer has an epidural Intrauterine pregnancy: 7232w1d     Secondary diagnosis:  Active Problems:   Normal labor   NSVD (normal spontaneous vaginal delivery)  Additional problems: post term preg     Discharge diagnosis: Term Pregnancy Delivered                                                                                                Post partum procedures:postpartum tubal ligation  Augmentation: Pitocin  Complications: None  Hospital course:  Induction of Labor With Vaginal Delivery   26 y.o. yo [id number] at 6532w1d was admitted to the hospital 03/11/2017 for induction of labor.  Indication for induction: Postdates.  Patient had an uncomplicated labor course as follows: Membrane Rupture Time/Date: 5:26 AM ,03/11/2017   Intrapartum Procedures: Episiotomy: None [1]                                         Lacerations:  None [1]  Patient had delivery of a Viable infant.  Information for the patient's newborn:  Kelly Monroe, Boy Avangeline [[id number]][[id number]]  Delivery Method: Vaginal, Spontaneous Delivery (Filed from Delivery Summary)   03/11/2017  Details of delivery can be found in separate delivery note. On PPD#0 pt had a ppBTL. Patient had a routine postpartum course; desires early discharge. Patient is discharged home 03/12/17.  Physical exam  Vitals:   03/11/17 1854 03/11/17 1900 03/12/17 0004 03/12/17 0411  BP: (!) 94/56 (!) 110/52 104/68 99/60  Pulse: 76 66 61 69  Resp: 18  18 18   Temp: 98.5 F (36.9 C)  98 F (36.7 C) 98 F (36.7 C)  TempSrc: Oral  Oral Oral  SpO2:      Weight:      Height:       General: alert and cooperative Lochia: appropriate Uterine Fundus: firm Incision: BTL dsg, marked and unchanged DVT Evaluation: No evidence of  DVT seen on physical exam. Labs: Lab Results  Component Value Date   WBC 13.6 (H) 03/11/2017   HGB 10.3 (L) 03/11/2017   HCT 31.8 (L) 03/11/2017   MCV 88.6 03/11/2017   PLT 207 03/11/2017   No flowsheet data found.  Discharge instruction: per After Visit Summary and "Baby and Me Booklet".  After visit meds:  Allergies as of 03/12/2017   No Known Allergies     Medication List    TAKE these medications   Butalbital-APAP-Caffeine 50-300-40 MG Caps Take 1 capsule by mouth every 6 (six) hours as needed (migraine).   ibuprofen 600 MG tablet Commonly known as:  ADVIL,MOTRIN Take 1 tablet (600 mg total) by mouth every 6 (six) hours as needed.   oxyCODONE 5 MG immediate release tablet Commonly known as:  Oxy IR/ROXICODONE Take 1 tablet (5 mg total) by mouth every  6 (six) hours as needed for severe pain.   VITAFOL GUMMIES 3.33-0.333-34.8 MG Chew Chew 3 tablets by mouth daily.       Diet: routine diet  Activity: Advance as tolerated. Pelvic rest for 6 weeks.   Outpatient follow up:4 weeks Follow up Appt:No future appointments. Follow up Visit:No Follow-up on file.  Postpartum contraception: Tubal Ligation  Newborn Data: Live born female  Birth Weight: 7 lb 11.1 oz (3490 g) APGAR: 9, 9  Baby Feeding: Breast Disposition:home with mother   03/12/2017 Cam HaiSHAW, Jaliah Foody, CNM  9:08 AM

## 2017-03-12 NOTE — Discharge Instructions (Signed)
Ligadura laparoscópica de trompas, cuidados posteriores °(Laparoscopic Tubal Ligation, Care After) °Siga estas instrucciones durante las próximas semanas. Estas indicaciones le proporcionan información acerca de cómo deberá cuidarse después del procedimiento. El médico también podrá darle instrucciones más específicas. El tratamiento ha sido planificado según las prácticas médicas actuales, pero en algunos casos pueden ocurrir problemas. Comuníquese con el médico si tiene algún problema o dudas después del procedimiento. °QUÉ ESPERAR DESPUÉS DEL PROCEDIMIENTO °Después del procedimiento, es común tener los siguientes síntomas: °Dolor de garganta. °Molestias en el hombro. °Calambres o molestias leves en el abdomen. °Dolor por gases. °Dolor o molestias en la zona donde se realizó el corte quirúrgico (incisión). °Sensación de hinchazón. °Cansancio. °Náuseas. °Vómitos. °INSTRUCCIONES PARA EL CUIDADO EN EL HOGAR °Medicamentos °Tome los medicamentos de venta libre y los recetados solamente como se lo haya indicado el médico. °No tome aspirina, ya que puede causar hemorragias. °No conduzca ni opere maquinaria pesada mientras toma analgésicos recetados. °Actividades °Haga reposo durante el resto del día. °Reanude sus actividades normales como se lo haya indicado el médico. Pregúntele al médico qué actividades son seguras para usted. °Cuidado de la incisión °Siga las indicaciones del médico acerca del cuidado de la incisión. Haga lo siguiente: °Lávese las manos con agua y jabón antes de cambiar las vendas (vendaje). Use desinfectante para manos si no dispone de agua y jabón. °Cambie el vendaje como se lo haya indicado el médico. °No retire los puntos (suturas). Tal vez deban dejarse puestos en la piel durante 2 semanas o más tiempo. °Controle todos los días la zona de la incisión para detectar signos de infección. Esté atento a los siguientes signos: °Aumento del enrojecimiento, la hinchazón o el dolor. °Más líquido o  sangre. °Calor. °Pus o mal olor. °Otras indicaciones °No tome baños de inmersión, no nade ni use el jacuzzi hasta que el médico lo autorice. Puede ducharse. °Concurra a todas las visitas de control como se lo haya indicado el médico. Esto es importante. °Pida a alguien que le ayude con las tareas domésticas diarias durante los primeros días. °SOLICITE ATENCIÓN MÉDICA SI: °Aumentan el enrojecimiento, la hinchazón o el dolor alrededor de la incisión. °La incisión está caliente al tacto. °Tiene pus o percibe que sale mal olor del lugar de la incisión. °Se le abren los bordes de la incisión después de que le hayan sacado las suturas. °El dolor no mejora después de 2 a 3 días. °Tiene una erupción cutánea. °Tiene mareos o sensación de desvanecimiento frecuentes. °El medicamento no le calma el dolor. °Tiene estreñimiento. °SOLICITE ATENCIÓN MÉDICA DE INMEDIATO SI: °Tiene fiebre. °Se desmaya. °Siente más dolor en el abdomen. °Tiene un dolor intenso en uno o ambos hombros. °Observa más líquido o sangre que sale de las suturas o de la vagina. °Le falta el aire o tiene dificultad para respirar. °Siente dolor en el pecho o en las piernas. °Tiene náuseas, vómitos o diarrea de forma continua. °Esta información no tiene como fin reemplazar el consejo del médico. Asegúrese de hacerle al médico cualquier pregunta que tenga. °Document Released: 01/13/2008 Document Revised: 11/18/2015 Document Reviewed: 07/07/2015 °Elsevier Interactive Patient Education © 2018 [employer] °Instrucciones para la mamá sobre los cuidados en el hogar °(Home Care Instructions for Mom) °ACTIVIDAD °· Reanude sus actividades regulares de forma gradual. °· Descanse. Tome siestas cuando el bebé duerme. °· No levante objetos que pesen más de 10 libras (4,5 kg) hasta que el médico se lo autorice. °· Evite las actividades que demandan mucho esfuerzo y energía (que son extenuantes) hasta que el médico se lo   autorice. Caminar a un ritmo tranquilo a moderado  siempre es más seguro. °· Si tuvo un parto por cesárea: °? No pase la aspiradora, suba escaleras o conduzca un vehículo durante 4 o 6 semanas. °? Pídale a alguien que le brinde ayuda con las tareas domésticas hasta que pueda realizarlas por su cuenta. °? Haga ejercicios como se lo haya indicado el médico, si corresponde. °  °HEMORRAGIA VAGINAL ° °Probablemente continúe sangrando durante 4 o 6 semanas después del parto. Generalmente, la cantidad de sangre disminuye y el color se hace más claro con el transcurso del tiempo. Sin embargo, si usted está demasiado activa, el color de la sangre puede ser rojo brillante. Si necesita cambiarse la compresa higiénica en menos de una hora o tiene coágulos grandes: °· Permanezca acostada. °· Eleve los pies. °· Coloque compresas frías en la zona inferior del abdomen. °· Haga reposo. °· Comuníquese con su médico. °Si está amamantando, podría volver a tener su período entre las 8 semanas después del parto y el momento en que deje de amamantar. Si no está amamantando, volverá a tener su período 6 u 8 semanas después del parto. °CUIDADOS PERINEALES °La zona perineal o perineo, es la parte del cuerpo que se encuentra entre los muslos. Después del parto, esta zona necesita un cuidado especial. Siga las siguientes indicaciones como se lo haya indicado su médico. °· Tome baños de inmersión durante 15 o 20 minutos. °· Utilice apósitos o aerosoles analgésicos y cremas como se lo hayan indicado. °· No utilice tampones ni se haga duchas vaginales hasta que el sangrado vaginal se haya detenido. °· Cada vez que vaya al baño: °? Use una botella perineal. °? Cámbiese el apósito. °? Use papel tisú en lugar de papel higiénico hasta que se cure la sutura. °· Haga ejercicios de Kegel todos los días. Los ejercicios Kegel ayudan a mantener los músculos que sostienen la vagina, la vejiga y los intestinos. Estos ejercicios se pueden realizar mientras está parada, sentada o acostada. Para hacer los  ejercicios de Kegel: °? Tense los músculos del estómago y los que rodean el canal de parto. °? Mantenga esta posición durante unos segundos. °? Relájese. °? Repita hasta hacerlos 5 veces seguidas. °· Para evitar las hemorroides o que estas empeoren: °? Beba suficiente líquido para mantener la orina clara o de color amarillo pálido. °? Evite hacer fuerza al defecar. °? Tome los medicamentos y laxantes de venta libre como se lo haya indicado el médico. °CUIDADO DE LAS MAMAS °· Use un buen sostén. °· Evite tomar analgésicos de venta libre para las molestias de los pechos. °· Aplique hielo en los pechos para aliviar las molestias tanto como sea necesario: °? Ponga el hielo en una bolsa plástica. °? Coloque una toalla entre la piel y la bolsa de hielo. °? Aplique el hielo durante 20, o como se lo haya indicado el médico. °  °NUTRICIÓN °· Mantenga una dieta bien balanceada. °· No intente perder de peso rápidamente reduciendo el consumo de calorías. °· Tome sus vitaminas prenatales hasta el control de postparto o hasta que su médico se lo indique. °  °DEPRESIÓN POSTPARTO ° °Puede sentir deseos de llorar sin motivo aparente y verse incapaz de enfrentarse a todos los cambios que implica tener un bebé. Este estado de ánimo se llama depresión postparto. La depresión postparto ocurre porque sus niveles hormonales sufren cambios después del parto. Si usted tiene depresión postparto, busque contención por parte de su pareja, sus amigos y su familia. Si la depresión no desaparece   por sí sola después de algunas semanas, concurra a su médico. °AUTOEXAMEN DE MAMAS °Realícese autoexámenes en el mismo momento cada mes. Si está amamantando, el mejor momento de controlar sus mamas es después de alimentar al bebé, cuando los pechos no están tan llenos. Si está amamantando y su período ya comenzó, controle sus mamas el día 5, 6 o 7 de su período. °Informe a su médico de cualquier protuberancia, bulto o secreción. Si está amamantando, las  mamas normalmente tienen bultos. Esto es transitorio y no es un riesgo para la salud. °INTIMIDAD Y SEXUALIDAD °Debe evitar las relaciones sexuales durante al menos 3 o 4 semanas después del parto o hasta que el flujo de color rojo amarronado haya desaparecido completamente. Si no desea quedar embarazada nuevamente, use algún método anticonceptivo. Después del parto, puede quedar embarazada incluso si no ha tenido todavía el período. °SOLICITE ATENCIÓN MÉDICA SI: °· Se siente incapaz de controlar los cambios que implica tener un hijo y esos sentimientos no desaparecen después de algunas semanas. °· Detecta una protuberancia, bulto o secreción en sus mamas. °  °SOLICITE ATENCIÓN MÉDICA DE INMEDIATO SI: °· Debe cambiarse la compresa higiénica en 1 hora o menos. °· Tiene los siguientes síntomas: °? Dolor intenso o calambres en la parte inferior del abdomen. °? Una secreción vaginal con mal olor. °? Fiebre que no se alivia con los medicamentos. °? Una zona de la mama se pone roja y le causa dolor, y además usted tiene fiebre. °? Una pantorrilla enrojecida y con dolor. °? Repentino e intenso dolor en el pecho. °? Falta de aire. °? Micción dolorosa o con sangre. °? Problemas visuales. °· Vómitos durante 12 horas o más. °· Dolor de cabeza intenso. °· Tiene pensamientos serios acerca de lastimarse a usted misma o dañar al niño o a otra persona. °  °Esta información no tiene como fin reemplazar el consejo del médico. Asegúrese de hacerle al médico cualquier pregunta que tenga. °Document Released: 07/27/2005 Document Revised: 11/18/2015 Document Reviewed: 01/28/2015 °Elsevier Interactive Patient Education © 2017 [employer] Inc. °  ° °

## 2019-05-02 ENCOUNTER — Encounter (HOSPITAL_COMMUNITY): Payer: Self-pay | Admitting: Emergency Medicine

## 2019-05-02 ENCOUNTER — Emergency Department (HOSPITAL_COMMUNITY)
Admission: EM | Admit: 2019-05-02 | Discharge: 2019-05-02 | Disposition: A | Discharge status: Home / Self Care | Payer: 59 | Attending: Emergency Medicine | Admitting: Emergency Medicine

## 2019-05-02 ENCOUNTER — Other Ambulatory Visit: Payer: Self-pay

## 2019-05-02 DIAGNOSIS — R110 Nausea: Secondary | ICD-10-CM | POA: Diagnosis not present

## 2019-05-02 DIAGNOSIS — R1030 Lower abdominal pain, unspecified: Secondary | ICD-10-CM | POA: Diagnosis present

## 2019-05-02 DIAGNOSIS — R309 Painful micturition, unspecified: Secondary | ICD-10-CM | POA: Diagnosis not present

## 2019-05-02 DIAGNOSIS — Z5321 Procedure and treatment not carried out due to patient leaving prior to being seen by health care provider: Secondary | ICD-10-CM | POA: Insufficient documentation

## 2019-05-02 LAB — URINALYSIS, ROUTINE W REFLEX MICROSCOPIC
Bilirubin Urine: NEGATIVE
Glucose, UA: NEGATIVE mg/dL
Ketones, ur: NEGATIVE mg/dL
Nitrite: NEGATIVE
Protein, ur: 100 mg/dL — AB
RBC / HPF: >50 RBC/hpf — ABNORMAL HIGH (ref 0–5)
Specific Gravity, Urine: 1.013 (ref 1.005–1.030)
WBC, UA: >50 WBC/hpf — ABNORMAL HIGH (ref 0–5)
pH: 8 (ref 5.0–8.0)

## 2019-05-02 LAB — CBC
HCT: 37.7 % (ref 36.0–46.0)
Hemoglobin: 12 g/dL (ref 12.0–15.0)
MCH: 30.1 pg (ref 26.0–34.0)
MCHC: 31.8 g/dL (ref 30.0–36.0)
MCV: 94.5 fL (ref 80.0–100.0)
Platelets: 285 10*3/uL (ref 150–400)
RBC: 3.99 MIL/uL (ref 3.87–5.11)
RDW: 12 % (ref 11.5–15.5)
WBC: 11.9 10*3/uL — ABNORMAL HIGH (ref 4.0–10.5)
nRBC: 0 % (ref 0.0–0.2)

## 2019-05-02 LAB — COMPREHENSIVE METABOLIC PANEL
ALT: 25 U/L (ref 0–44)
AST: 27 U/L (ref 15–41)
Albumin: 4.1 g/dL (ref 3.5–5.0)
Alkaline Phosphatase: 47 U/L (ref 38–126)
Anion gap: 7 (ref 5–15)
BUN: 10 mg/dL (ref 6–20)
CO2: 24 mmol/L (ref 22–32)
Calcium: 8.8 mg/dL — ABNORMAL LOW (ref 8.9–10.3)
Chloride: 107 mmol/L (ref 98–111)
Creatinine, Ser: 0.64 mg/dL (ref 0.44–1.00)
GFR calc Af Amer: >60 mL/min (ref >60)
GFR calc non Af Amer: >60 mL/min (ref >60)
Glucose, Bld: 105 mg/dL — ABNORMAL HIGH (ref 70–99)
Potassium: 3.4 mmol/L — ABNORMAL LOW (ref 3.5–5.1)
Sodium: 138 mmol/L (ref 135–145)
Total Bilirubin: 0.5 mg/dL (ref 0.3–1.2)
Total Protein: 6.8 g/dL (ref 6.5–8.1)

## 2019-05-02 LAB — I-STAT BETA HCG BLOOD, ED (MC, WL, AP ONLY): I-stat hCG, quantitative: <5 m[IU]/mL (ref <5)

## 2019-05-02 LAB — LIPASE, BLOOD: Lipase: 23 U/L (ref 11–51)

## 2019-05-02 MED ORDER — SODIUM CHLORIDE 0.9% FLUSH: 3.0000 mL | Freq: Once | INTRAVENOUS | Status: DC

## 2019-05-02 NOTE — ED Triage Notes (Signed)
Pt c/o lower abdominal pain, nausea, and pain with urination that started yesterday.

## 2023-08-26 DIAGNOSIS — E538 Deficiency of other specified B group vitamins: Secondary | ICD-10-CM | POA: Diagnosis not present

## 2024-04-05 DIAGNOSIS — I83811 Varicose veins of right lower extremities with pain: Secondary | ICD-10-CM | POA: Diagnosis not present

## 2024-05-31 DIAGNOSIS — I83811 Varicose veins of right lower extremities with pain: Secondary | ICD-10-CM | POA: Diagnosis not present
